# Patient Record
Sex: Female | Born: 1975 | Race: Black or African American | Hispanic: No | Marital: Single | Smoking: Former smoker
Health system: Southern US, Community
[De-identification: ages and names within clinical notes are randomized; demographics above are authoritative.]

## PROBLEM LIST (undated history)

## (undated) DIAGNOSIS — I10 Essential (primary) hypertension: Secondary | ICD-10-CM

## (undated) HISTORY — PX: NO PAST SURGERIES: SHX2092

---

## 1999-06-14 ENCOUNTER — Emergency Department (HOSPITAL_COMMUNITY): Admission: EM | Admit: 1999-06-14 | Discharge: 1999-06-14 | Payer: Self-pay | Admitting: *Deleted

## 2008-06-13 ENCOUNTER — Emergency Department: Payer: Self-pay | Admitting: Emergency Medicine

## 2008-06-15 ENCOUNTER — Emergency Department: Payer: Self-pay | Admitting: Emergency Medicine

## 2008-06-20 ENCOUNTER — Emergency Department: Payer: Self-pay | Admitting: Emergency Medicine

## 2009-02-21 ENCOUNTER — Encounter: Payer: Self-pay | Admitting: Obstetrics and Gynecology

## 2009-04-04 ENCOUNTER — Encounter: Payer: Self-pay | Admitting: Pediatric Cardiology

## 2009-10-20 IMAGING — US US OB FOLLOW-UP - NRPT [facility]
1 series · 14 of 28 positions shown · non-contrast
Comparison: none

[Series 1: us ob follow-up - [facility]pt [facility] · 14 of 48 slices shown]
[im 2/48]
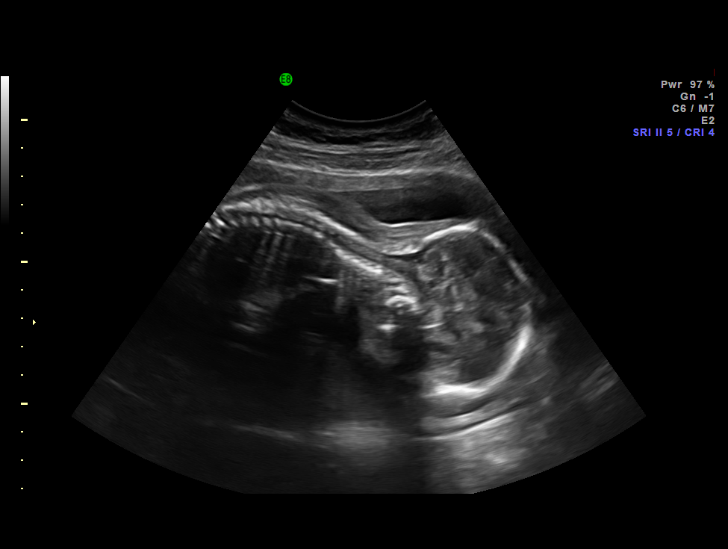
[im 6/48]
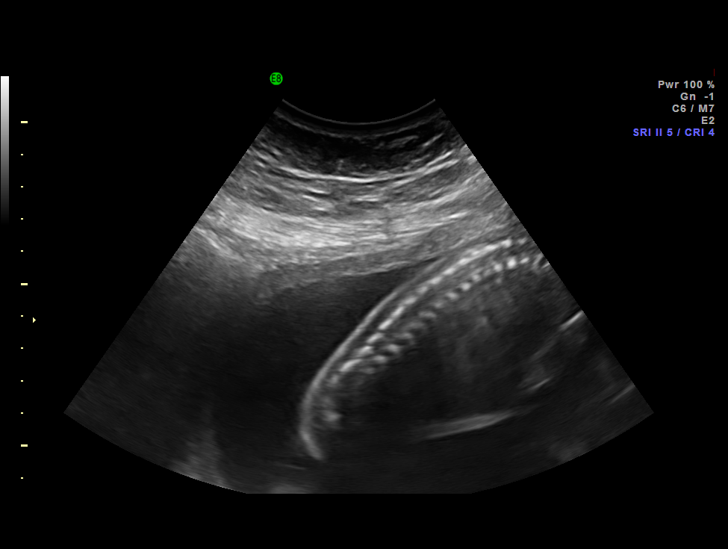
[im 9/48]
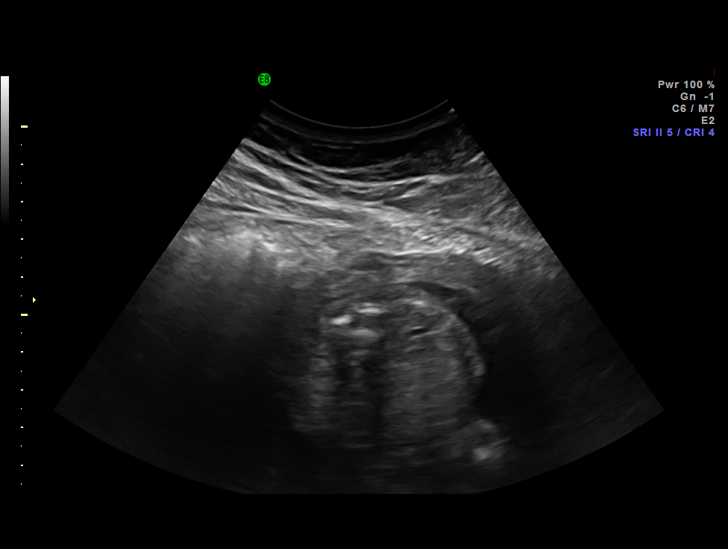
[im 13/48]
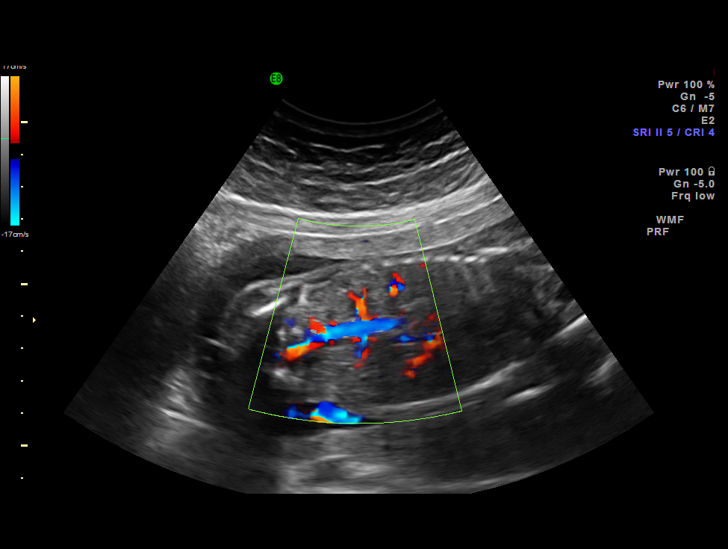
[im 16/48]
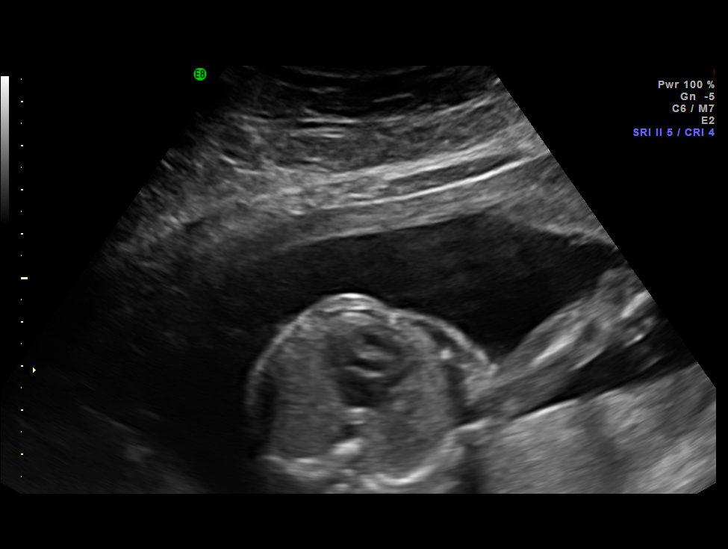
[im 20/48]
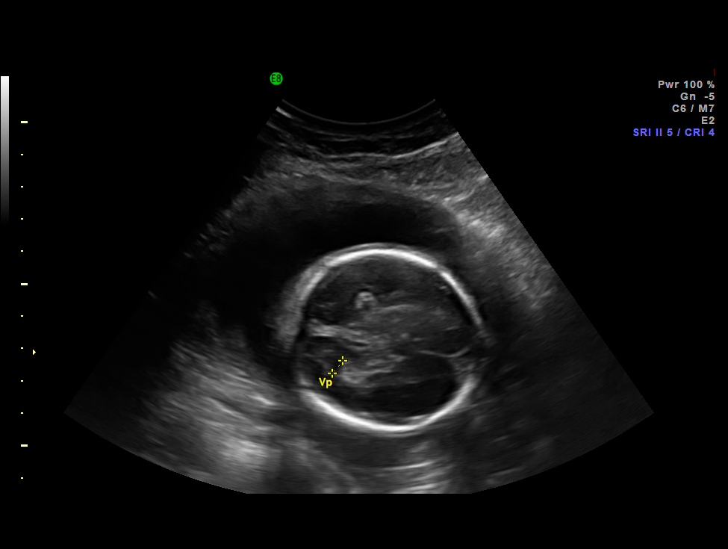
[im 23/48]
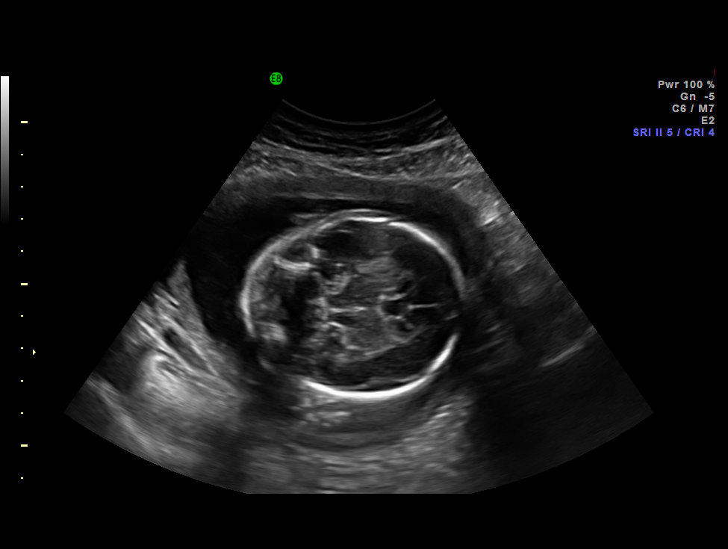
[im 27/48]
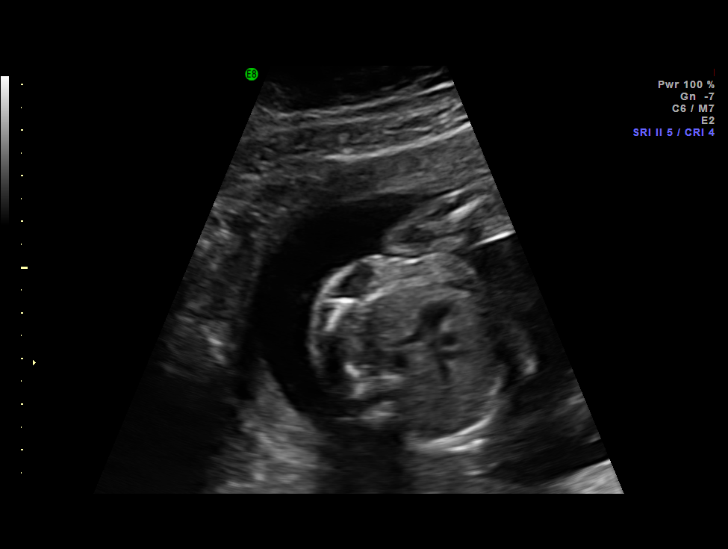
[im 30/48]
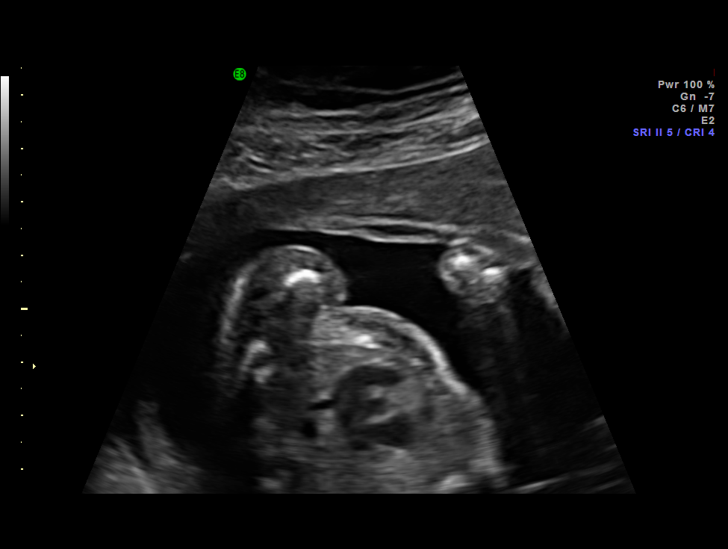
[im 34/48]
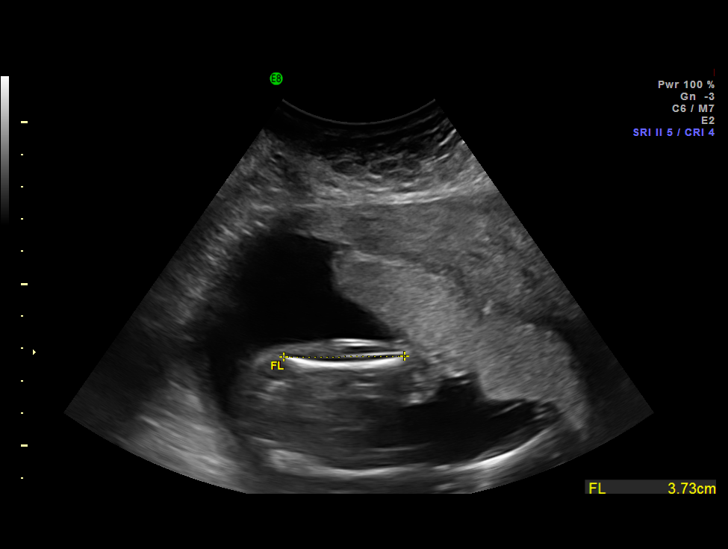
[im 37/48]
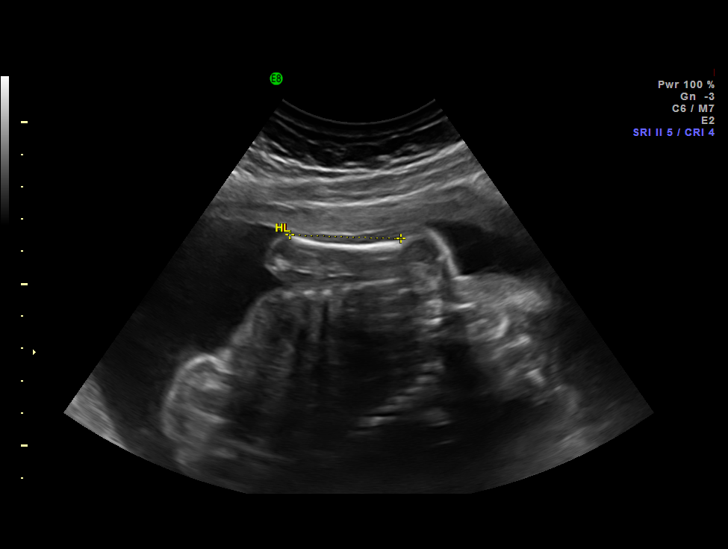
[im 41/48]
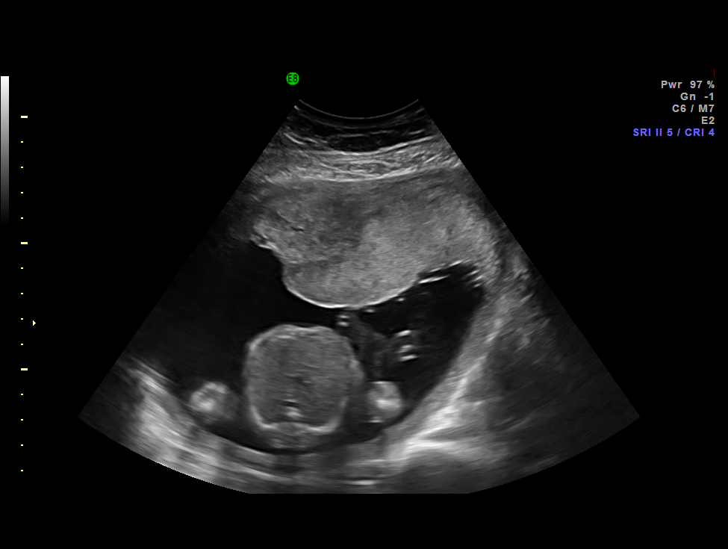
[im 44/48]
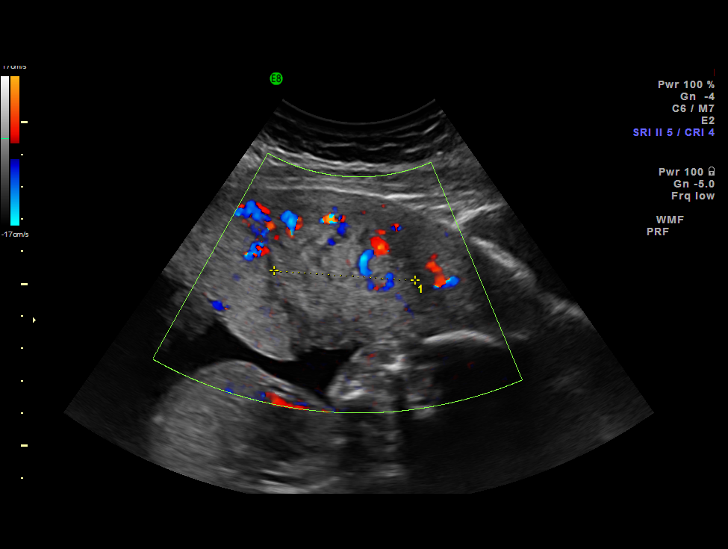
[im 48/48]
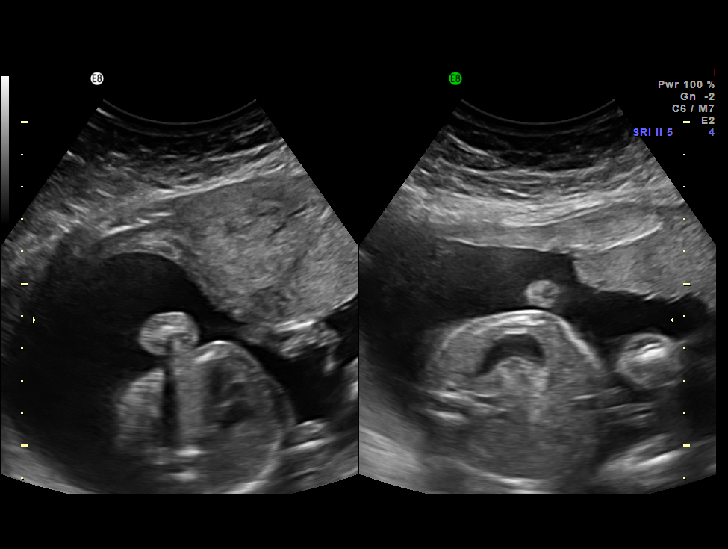

[14 of 28 positions shown; findings below may reference images not displayed]

IMAGES IMPORTED FROM THE [employer] WORKFLOW SYSTEM
NO DICTATION FOR STUDY

## 2011-10-01 ENCOUNTER — Ambulatory Visit: Payer: Self-pay

## 2011-12-21 ENCOUNTER — Ambulatory Visit: Payer: Self-pay

## 2013-07-27 ENCOUNTER — Ambulatory Visit: Payer: Self-pay | Admitting: Physician Assistant

## 2013-07-27 LAB — WET PREP, GENITAL

## 2013-07-27 LAB — GC/CHLAMYDIA PROBE AMP

## 2014-07-17 ENCOUNTER — Ambulatory Visit: Payer: Self-pay | Admitting: Internal Medicine

## 2015-07-19 ENCOUNTER — Ambulatory Visit
Admission: EM | Admit: 2015-07-19 | Discharge: 2015-07-19 | Disposition: A | Discharge status: Home / Self Care | Payer: BLUE CROSS/BLUE SHIELD | Attending: Family Medicine | Admitting: Family Medicine

## 2015-07-19 ENCOUNTER — Encounter: Payer: Self-pay | Admitting: Emergency Medicine

## 2015-07-19 DIAGNOSIS — I10 Essential (primary) hypertension: Secondary | ICD-10-CM

## 2015-07-19 DIAGNOSIS — N912 Amenorrhea, unspecified: Secondary | ICD-10-CM | POA: Diagnosis not present

## 2015-07-19 LAB — BASIC METABOLIC PANEL
Anion gap: 6 (ref 5–15)
BUN: 17 mg/dL (ref 6–20)
CHLORIDE: 100 mmol/L — AB (ref 101–111)
CO2: 30 mmol/L (ref 22–32)
Calcium: 9.1 mg/dL (ref 8.9–10.3)
Creatinine, Ser: 0.6 mg/dL (ref 0.44–1.00)
GFR calc Af Amer: >60 mL/min (ref >60)
GLUCOSE: 110 mg/dL — AB (ref 65–99)
Potassium: 4.1 mmol/L (ref 3.5–5.1)
SODIUM: 136 mmol/L (ref 135–145)

## 2015-07-19 LAB — CBC WITH DIFFERENTIAL/PLATELET
Basophils Absolute: 0 10*3/uL (ref 0–0.1)
Basophils Relative: 1 %
EOS ABS: 0.2 10*3/uL (ref 0–0.7)
EOS PCT: 4 %
HCT: 37.4 % (ref 35.0–47.0)
Hemoglobin: 12.5 g/dL (ref 12.0–16.0)
LYMPHS ABS: 1.6 10*3/uL (ref 1.0–3.6)
Lymphocytes Relative: 23 %
MCH: 30.5 pg (ref 26.0–34.0)
MCHC: 33.3 g/dL (ref 32.0–36.0)
MCV: 91.6 fL (ref 80.0–100.0)
Monocytes Absolute: 0.6 10*3/uL (ref 0.2–0.9)
Monocytes Relative: 9 %
Neutro Abs: 4.4 10*3/uL (ref 1.4–6.5)
Neutrophils Relative %: 63 %
PLATELETS: 258 10*3/uL (ref 150–440)
RBC: 4.09 MIL/uL (ref 3.80–5.20)
RDW: 13.9 % (ref 11.5–14.5)
WBC: 6.9 10*3/uL (ref 3.6–11.0)

## 2015-07-19 LAB — HCG, QUANTITATIVE, PREGNANCY: hCG, Beta Chain, Quant, S: <1 m[IU]/mL (ref <5)

## 2015-07-19 MED ORDER — HYDROCHLOROTHIAZIDE 12.5 MG PO CAPS: 12.5000 mg | ORAL_CAPSULE | Freq: Every day | ORAL | Status: DC

## 2015-07-19 NOTE — Discharge Instructions (Signed)
As discussed follow-up closely with your primary care physician and OB/GYN. Practice safe sex. As discussed use backup contraception.   Follow-up within the next week.  Monitor diet. Follow DASH diet. Exercise regularly.  Return to urgent care proceed to the ER for chest pain, shortness of breath, dizziness, weakness, persistent headaches, new or worsening concerns.   Menstruation Menstruation is the monthly passing of blood, tissue, fluid, and mucus. It is also known as a period. Your body is shedding the lining of the uterus. The flow of blood usually occurs during 3-7 consecutive days each month. Hormones control the menstrual cycle. Hormones are a chemical substance produced by endocrine glands in the body to regulate different bodily functions. The first menstrual period may start any time between age 33 years to 65 years. However, it usually starts around age 33 years. Some girls have regular monthly menstrual cycles right from the beginning. However, it is not unusual to have only a couple of drops of blood or spotting when you first start menstruating. It is also not unusual to have two periods a month or miss a month or two when first starting your periods. SYMPTOMS   Mild to moderate abdominal cramps.  Aching or pain in the lower back area. Symptoms may occur 5-10 days before your menstrual period starts. These symptoms are referred to as premenstrual syndrome (PMS). These symptoms can include:  Headache.  Breast tenderness and swelling.  Bloating.  Tiredness (fatigue).  Mood changes.  Craving for certain foods. These are normal signs and symptoms and can vary in severity. To help relieve these problems, ask your caregiver if you can take over-the-counter medications for pain or discomfort. If the symptoms are not controllable, see your caregiver for help.  HORMONES INVOLVED IN MENSTRUATION Menstruation comes about because of hormones produced by the pituitary gland in the  brain and the ovaries that affect the uterine lining. First, the pituitary gland in the brain produces the hormone follicle stimulating hormone [facility]). [place] stimulates the ovaries to produce estrogen, which thickens the uterine lining and begins to develop an egg in the ovary. About 14 days later, the pituitary gland produces another hormone called luteinizing hormone (LH). LH causes the egg to come out of a sac in the ovary (ovulation). The empty sac on the ovary called the corpus luteum is stimulated by another hormone from the pituitary gland called luteotropin. The corpus luteum begins to produce the estrogen and progesterone hormone. The progesterone hormone prepares the lining of the uterus to have the fertilized egg (egg combined with sperm) attach to the lining of the uterus and begin to develop into a fetus. If the egg is not fertilized, the corpus luteum stops producing estrogen and progesterone, it disappears, the lining of the uterus sloughs off and a menstrual period begins. Then the menstrual cycle starts all over again and will continue monthly unless pregnancy occurs or menopause begins. The secretion of hormones is complex. Various parts of the body become involved in many chemical activities. Female sex hormones have other functions in a woman's body as well. Estrogen increases a woman's sex drive (libido). It naturally helps body get rid of fluids (diuretic). It also aids in the process of building new bone. Therefore, maintaining hormonal health is essential to all levels of a woman's well being. These hormones are usually present in normal amounts and cause you to menstruate. It is the relationship between the (small) levels of the hormones that is critical. When the balance is upset,  menstrual irregularities can occur. HOW DOES THE MENSTRUAL CYCLE HAPPEN?  Menstrual cycles vary in length from 21-35 days with an average of 29 days. The cycle begins on the first day of bleeding. At this time,  the pituitary gland in the brain releases [place] that travels through the bloodstream to the ovaries. The [facility] stimulates the follicles in the ovaries. This prepares the body for ovulation that occurs around the 14th day of the cycle. The ovaries produce estrogen, and this makes sure conditions are right in the uterus for implantation of the fertilized egg.  When the levels of estrogen reach a high enough level, it signals the gland in the brain (pituitary gland) to release a surge of LH. This causes the release of the ripest egg from its follicle (ovulation). Usually only one follicle releases one egg, but sometimes more than one follicle releases an egg especially when stimulating the ovaries for in vitro fertilization. The egg can then be collected by either fallopian tube to await fertilization. The burst follicle within the ovary that is left behind is now called the corpus luteum or "yellow body." The corpus luteum continues to give off (secrete) reduced amounts of estrogen. This closes and hardens the cervix. It dries up the mucus to the naturally infertile condition.  The corpus luteum also begins to give off greater amounts of progesterone. This causes the lining of the uterus (endometrium) to thicken even more in preparation for the fertilized egg. The egg is starting to journey down from the fallopian tube to the uterus. It also signals the ovaries to stop releasing eggs. It assists in returning the cervical mucus to its infertile state.  If the egg implants successfully into the womb lining and pregnancy occurs, progesterone levels will continue to raise. It is often this hormone that gives some pregnant women a feeling of well being, like a "natural high." Progesterone levels drop again after childbirth.  If fertilization does not occur, the corpus luteum dies, stopping the production of hormones. This sudden drop in progesterone causes the uterine lining to break down, accompanied by blood  (menstruation).  This starts the cycle back at day 1. The whole process starts all over again. Woman go through this cycle every month from puberty to menopause. Women have breaks only for pregnancy and breastfeeding (lactation), unless the woman has health problems that affect the female hormone system or chooses to use oral contraceptives to have unnatural menstrual periods. HOME CARE INSTRUCTIONS   Keep track of your periods by using a calendar.  If you use tampons, get the least absorbent to avoid toxic shock syndrome.  Do not leave tampons in the vagina over night or longer than 6 hours.  Wear a sanitary pad over night.  Exercise 3-5 times a week or more.  Avoid foods and drinks that you know will make your symptoms worse before or during your period. SEEK MEDICAL CARE IF:   You develop a fever with your period.  Your periods are lasting more than 7 days.  Your period is so heavy that you have to change pads or tampons every 30 minutes.  You develop clots with your period and never had clots before.  You cannot get relief from over-the-counter medication for your symptoms.  Your period has not started, and it has been longer than 35 days.   This information is not intended to replace advice given to you by your health care provider. Make sure you discuss any questions you have with your  health care provider.   Document Released: 06/22/2002 Document Revised: 03/23/2015 Document Reviewed: 01/29/2013 Elsevier Interactive Patient Education 2016 [employer].  Hypertension Hypertension, commonly called high blood pressure, is when the force of blood pumping through your arteries is too strong. Your arteries are the blood vessels that carry blood from your heart throughout your body. A blood pressure reading consists of a higher number over a lower number, such as 110/72. The higher number (systolic) is the pressure inside your arteries when your heart pumps. The lower number  (diastolic) is the pressure inside your arteries when your heart relaxes. Ideally you want your blood pressure below 120/80. Hypertension forces your heart to work harder to pump blood. Your arteries may become narrow or stiff. Having untreated or uncontrolled hypertension can cause heart attack, stroke, kidney disease, and other problems. RISK FACTORS Some risk factors for high blood pressure are controllable. Others are not.  Risk factors you cannot control include:   Race. You may be at higher risk if you are African American.  Age. Risk increases with age.  Gender. Men are at higher risk than women before age 35 years. After age 52, women are at higher risk than men. Risk factors you can control include:  Not getting enough exercise or physical activity.  Being overweight.  Getting too much fat, sugar, calories, or salt in your diet.  Drinking too much alcohol. SIGNS AND SYMPTOMS Hypertension does not usually cause signs or symptoms. Extremely high blood pressure (hypertensive crisis) may cause headache, anxiety, shortness of breath, and nosebleed. DIAGNOSIS To check if you have hypertension, your health care provider will measure your blood pressure while you are seated, with your arm held at the level of your heart. It should be measured at least twice using the same arm. Certain conditions can cause a difference in blood pressure between your right and left arms. A blood pressure reading that is higher than normal on one occasion does not mean that you need treatment. If it is not clear whether you have high blood pressure, you may be asked to return on a different day to have your blood pressure checked again. Or, you may be asked to monitor your blood pressure at home for 1 or more weeks. TREATMENT Treating high blood pressure includes making lifestyle changes and possibly taking medicine. Living a healthy lifestyle can help lower high blood pressure. You may need to change some of  your habits. Lifestyle changes may include:  Following the DASH diet. This diet is high in fruits, vegetables, and whole grains. It is low in salt, red meat, and added sugars.  Keep your sodium intake below 2,300 mg per day.  Getting at least 30-45 minutes of aerobic exercise at least 4 times per week.  Losing weight if necessary.  Not smoking.  Limiting alcoholic beverages.  Learning ways to reduce stress. Your health care provider may prescribe medicine if lifestyle changes are not enough to get your blood pressure under control, and if one of the following is true:  You are 51-50 years of age and your systolic blood pressure is above 140.  You are 3 years of age or older, and your systolic blood pressure is above 150.  Your diastolic blood pressure is above 90.  You have diabetes, and your systolic blood pressure is over [id number] or your diastolic blood pressure is over 90.  You have kidney disease and your blood pressure is above 140/90.  You have heart disease and your blood pressure  is above 140/90. Your personal target blood pressure may vary depending on your medical conditions, your age, and other factors. HOME CARE INSTRUCTIONS  Have your blood pressure rechecked as directed by your health care provider.   Take medicines only as directed by your health care provider. Follow the directions carefully. Blood pressure medicines must be taken as prescribed. The medicine does not work as well when you skip doses. Skipping doses also puts you at risk for problems.  Do not smoke.   Monitor your blood pressure at home as directed by your health care provider. SEEK MEDICAL CARE IF:   You think you are having a reaction to medicines taken.  You have recurrent headaches or feel dizzy.  You have swelling in your ankles.  You have trouble with your vision. SEEK IMMEDIATE MEDICAL CARE IF:  You develop a severe headache or confusion.  You have unusual weakness, numbness,  or feel faint.  You have severe chest or abdominal pain.  You vomit repeatedly.  You have trouble breathing. MAKE SURE YOU:   Understand these instructions.  Will watch your condition.  Will get help right away if you are not doing well or get worse.   This information is not intended to replace advice given to you by your health care provider. Make sure you discuss any questions you have with your health care provider.   Document Released: 07/02/2005 Document Revised: 11/16/2014 Document Reviewed: 04/24/2013 Elsevier Interactive Patient Education [employer].

## 2015-07-19 NOTE — ED Notes (Addendum)
Pt last menstrual period in September, taken 4 store pregnancy tests, all negative. Most recent home urine test last week. Pt was on spentricks birth control, in Sept. Her doctor gave her a lower dose birth control because high BP. Pt stopped taking the new birth control pill in October. Pt has not been to see PCP about BP yet. Pt concerned pregnant or premenopausal.

## 2015-07-19 NOTE — ED Provider Notes (Signed)
[facility]  ____________________________________________  Time seen: Approximately 7:19 PM  I have reviewed the triage vital signs and the nursing notes.   HISTORY  Chief Complaint Amenorrhea   HPI Latice A Bruemmer is a 40 y.o. female patient presents for the request for pregnancy test. Patient states that in September she was evaluated by her OB/GYN for her yearly exam. Patient states at that time her doctor discussed with her that her blood pressure was elevated. Patient reports that because her blood pressure was elevated at that time her doctor changed her oral birth control medications. Reports that she change the oral birth control medicine to a prescription that had lower counseled estrogen, and patient was encouraged to follow-up with her primary care physician for adjusting the blood pressure. Patient states that she has not followed up with her primary care physician yet, but reports that she was planning on doing so in the next few weeks. Patient reports last sexual encounter was approximately 2 weeks ago.  Patient states that in switching oral birth control medicines she did not have a menstrual cycle in October and states that because of this she then stopped her birth control completely as concerned she could've become pregnant. Patient reports that she has since been using condoms for sexual activity. Reports that she is sexually active with 1 partner. Denies sexual partner changes. Reports that her menstrual in September was normal. Denies vaginal odor, vaginal discharge, vaginal complaints. Patient denies concerns for STDs. Patient states that she took multiple pregnancy tests at home and they were all negative. Patient states that she just wanted to make sure that she was not pregnant and also states that she is planning on resuming the birth control medicine that was prescribed by her OB/GYN.  Patient reports that she's been told her blood pressure was borderline  elevated in the last 1-2 years. Patient present she has a strong family history of hypertension.  Denies chest pain, shortness of breath, dizziness, headaches, weakness, extremity swelling or other complaints. Denies nausea, vomiting, diarrhea, breast tenderness, weight gain or weight loss. Reports continues to eat and drink as normal.  Patient pressure primary care physician is at [facility].  History reviewed. No pertinent past medical history.  There are no active problems to display for this patient.   History reviewed. No pertinent past surgical history.  No current outpatient prescriptions on file.  Allergies Review of patient's allergies indicates no known allergies.   family history Mother: HTN Father:HTN Brother: HTN  Social History Social History  Substance Use Topics  . Smoking status: Former Research scientist (life sciences)  . Smokeless tobacco: None  . Alcohol Use: Yes    Review of Systems Constitutional: No fever/chills Eyes: No visual changes. ENT: No sore throat. Cardiovascular: Denies chest pain. Respiratory: Denies shortness of breath. Gastrointestinal: No abdominal pain.  No nausea, no vomiting.  No diarrhea.  No constipation. Genitourinary: Negative for dysuria. Musculoskeletal: Negative for back pain. Skin: Negative for rash. Neurological: Negative for headaches, focal weakness or numbness.  10-point ROS otherwise negative.  ____________________________________________   PHYSICAL EXAM:  VITAL SIGNS: ED Triage Vitals  Enc Vitals Group     BP 07/19/15 1808 168/124 mmHg     Pulse Rate 07/19/15 1808 91     Resp 07/19/15 1808 18     Temp 07/19/15 1808 98.1 F (36.7 C)     Temp Source 07/19/15 1808 Oral     SpO2 07/19/15 1808 100 %     Weight 07/19/15 1808  184 lb (83.462 kg)     Height 07/19/15 1808 5\' 8"  (1.727 m)     Head Cir --      Peak Flow --      Pain Score --      Pain Loc --      Pain Edu? --      Excl. in [place]? --    Today's Vitals   07/19/15  1808 07/19/15 1907  BP: 168/124 163/102  Pulse: 91   Temp: 98.1 F (36.7 C)   TempSrc: Oral   Resp: 18   Height: 5\' 8"  (1.727 m)   Weight: 184 lb (83.462 kg)   SpO2: 100%     Constitutional: Alert and oriented. Well appearing and in no acute distress. Eyes: Conjunctivae are normal. PERRL. EOMI. Head: Atraumatic.  Nose: No congestion/rhinnorhea.  Mouth/Throat: Mucous membranes are moist.  Oropharynx non-erythematous.  Neck: No stridor.  No cervical spine tenderness to palpation. Hematological/Lymphatic/Immunilogical: No cervical lymphadenopathy. Cardiovascular: Normal rate, regular rhythm. Grossly normal heart sounds.  Good peripheral circulation. Respiratory: Normal respiratory effort.  No retractions. Lungs CTAB. Gastrointestinal: Soft and nontender. No distention. Normal Bowel sounds.  No CVA tenderness. Musculoskeletal: No lower or upper extremity tenderness nor edema.Bilateral pedal pulses equal and easily palpated.  Neurologic:  Normal speech and language. No gross focal neurologic deficits are appreciated. No gait instability. Skin:  Skin is warm, dry and intact. No rash noted. Psychiatric: Mood and affect are normal. Speech and behavior are normal.  ____________________________________________   LABS (all labs ordered are listed, but only abnormal results are displayed)  Labs Reviewed  BASIC METABOLIC PANEL - Abnormal; Notable for the following:    Chloride 100 (*)    Glucose, Bld 110 (*)    All other components within normal limits  HCG, QUANTITATIVE, PREGNANCY  CBC WITH DIFFERENTIAL/PLATELET     INITIAL IMPRESSION / ASSESSMENT AND PLAN / ED COURSE  Pertinent labs & imaging results that were available during my care of the patient were reviewed by me and considered in my medical decision making (see chart for details).  Very well-appearing patient. No acute distress. Presents for request of pregnancy test. Patient states that she recently has changed oral birth  control pills and states that she then stopped them for 2 months and states that she wants to make sure that she is not pregnant. Patient patient also reports that she has been told in the past that she is borderline high blood pressure and reports that her blood pressure is elevated at her last yearly exam with her OB/GYN. Reports that she was directed to follow-up with her PCP for blood pressure management but states that she has not yet followed up. Patient states she plans to follow-up with her primary care physician next 2 weeks.   Patient reports that she generally has normal menstrual cycles until the recent medication changes. Patient reports that her OB/GYN told her that her menstrual was maybe are irregular at first with the medicine changes. HCG less than 1. Discussed patient pregnancy is negative at this time. Counseled regarding safe sex and backup safety measures as patient states that she does not want to become pregnant. Also discussed with patient in detail regarding importance of a pressure follow-up and management. Patient denies chest pain or shortness of breath, dizziness, vision changes., weakness, headaches or other complaints. Patient states that she feels well. Patient asymptomatic of elevated blood pressure. Blood pressure recheck 163/102. Discussed with patient to follow-up in the next 2 weeks  with PCP for blood pressure management. Will evaluate CBC and BMP and discussed with patient initiation of antihypertensive medications. Counseled patient on regular exercise, DASH diet as well as monitoring blood pressure closely and keep a journal at home.Also counseled patient to follow-up closely with OB/GYN in regards to menstrual cycles.  Patient states that she had to leave the urgent care prior to the labs being returned. Prior patient leaving urgent care discussed with her prescription of HCTZ pending unremarkable labs, as well as discussed risks and benefits of medications. Labs  reviewed. Will start patient on HCTZ 12.5 mg daily. Called patient twice on cell phone and left message for patient to return phone call. Prescription sent to patient's pharmacy electronically.  Discussed follow up with Primary care physician this week. Discussed follow up and return parameters urgent care or ER for chest pain, shortness of breath, weakness, dizziness, headaches, vision changes, no resolution or any worsening concerns. Patient verbalized understanding and agreed to plan.   ____________________________________________   FINAL CLINICAL IMPRESSION(S) / ED DIAGNOSES  Final diagnoses:  Amenorrhea  Essential hypertension     Marylene Land, NP 07/19/15 2025  Addendum:07/18/2014 2120: Called and discussed lab results with patient as well as rediscussed hypertension medication. Patient states that she will call to schedule her PCP follow up tomorrow as well as start medicine tomorrow.Discussed follow up with Primary care physician this week. Discussed follow up and return parameters including no resolution or any worsening concerns. Patient verbalized understanding and agreed to plan.    Marylene Land, NP 07/19/15 2121

## 2015-07-20 ENCOUNTER — Telehealth: Payer: Self-pay | Admitting: *Deleted

## 2015-07-20 NOTE — ED Notes (Signed)
Patient called and informed that all of her lab results ar normal. Patient confirmed understanding of information.

## 2016-06-21 ENCOUNTER — Telehealth: Payer: Self-pay

## 2016-12-03 ENCOUNTER — Ambulatory Visit
Admission: EM | Admit: 2016-12-03 | Discharge: 2016-12-03 | Disposition: A | Discharge status: Home / Self Care | Payer: BLUE CROSS/BLUE SHIELD | Attending: Family Medicine | Admitting: Family Medicine

## 2016-12-03 ENCOUNTER — Encounter: Payer: Self-pay | Admitting: *Deleted

## 2016-12-03 DIAGNOSIS — B9689 Other specified bacterial agents as the cause of diseases classified elsewhere: Secondary | ICD-10-CM | POA: Diagnosis not present

## 2016-12-03 DIAGNOSIS — N760 Acute vaginitis: Secondary | ICD-10-CM | POA: Diagnosis not present

## 2016-12-03 LAB — CHLAMYDIA/NGC RT PCR (ARMC ONLY)
Chlamydia Tr: NOT DETECTED
N gonorrhoeae: NOT DETECTED

## 2016-12-03 LAB — WET PREP, GENITAL
SPERM: NONE SEEN
TRICH WET PREP: NONE SEEN
YEAST WET PREP: NONE SEEN

## 2016-12-03 MED ORDER — METRONIDAZOLE 500 MG PO TABS: 500.0000 mg | ORAL_TABLET | Freq: Two times a day (BID) | ORAL | 0 refills | Status: DC

## 2016-12-03 NOTE — Discharge Instructions (Signed)
Take medication as prescribed. Rest. Drink plenty of fluids. No alcohol consumption with medication.  Follow up with your primary care physician this week as needed. Return to Urgent care for new or worsening concerns.

## 2016-12-03 NOTE — ED Triage Notes (Signed)
Patient has had symptom of vaginal discharge for 1 month. Patient does have a history of bacterial vaginitis.

## 2016-12-03 NOTE — ED Provider Notes (Signed)
[facility] ____________________________________________  Time seen: Approximately 2:30 PM  I have reviewed the triage vital signs and the nursing notes.   HISTORY  Chief Complaint Vaginal Discharge   HPI Veronica Berg is a 41 y.o. female presenting for evaluation of one month whitish vaginal discharge with odor. Denies any urinary frequency, urinary urgency, pain with urination or dysuria. States no vaginal pain, abdominal pain, back pain or discomfort. Patient reports that she has had bacterial vaginosis in the past with similar presentation. Patient reports that she is sexually active with 1 partner. Discussed with patient if any concerns of STDs, patient states would like to have gonorrhea and chlamydia testing, declines other testing for other STDs. Reports continues to eat and drink well. Denies any aggravating or alleviating factors.  Denies chest pain, shortness of breath, abdominal pain, dysuria, extremity pain, extremity swelling or rash. Denies recent sickness. Denies recent antibiotic use.   Patient's last menstrual period was 11/08/2016. Denies concern of pregnancy [place]: PCP   History reviewed. No pertinent past medical history. Denies  There are no active problems to display for this patient.   History reviewed. No pertinent surgical history.   No current facility-administered medications for this encounter.   Current Outpatient Prescriptions:  .  hydrochlorothiazide (MICROZIDE) 12.5 MG capsule, Take 1 capsule (12.5 mg total) by mouth daily., Disp: 14 capsule, Rfl: 0 .  metroNIDAZOLE (FLAGYL) 500 MG tablet, Take 1 tablet (500 mg total) by mouth 2 (two) times daily., Disp: 14 tablet, Rfl: 0  Allergies Patient has no known allergies.  Family History  Problem Relation Age of Onset  . Hypertension Mother   . Cancer Father     Social History Social History  Substance Use Topics  . Smoking status: Former Research scientist (life sciences)  .  Smokeless tobacco: Never Used  . Alcohol use Yes    Review of Systems Constitutional: No fever/chills Cardiovascular: Denies chest pain. Respiratory: Denies shortness of breath. Gastrointestinal: No abdominal pain.  No nausea, no vomiting.  No diarrhea.  No constipation. Genitourinary: Negative for dysuria. As above.  Musculoskeletal: Negative for back pain. Skin: Negative for rash.   ____________________________________________   PHYSICAL EXAM:  VITAL SIGNS: ED Triage Vitals  Enc Vitals Group     BP 12/03/16 1406 (!) 155/101     Pulse Rate 12/03/16 1406 74     Resp 12/03/16 1406 16     Temp 12/03/16 1406 98.2 F (36.8 C)     Temp Source 12/03/16 1406 Oral     SpO2 12/03/16 1406 100 %     Weight 12/03/16 1407 174 lb (78.9 kg)     Height 12/03/16 1407 5\' 7"  (1.702 m)     Head Circumference --      Peak Flow --      Pain Score 12/03/16 1411 0     Pain Loc --      Pain Edu? --      Excl. in [place]? --     Constitutional: Alert and oriented. Well appearing and in no acute distress. Cardiovascular: Normal rate, regular rhythm. Grossly normal heart sounds.  Good peripheral circulation. Respiratory: Normal respiratory effort without tachypnea nor retractions. Breath sounds are clear and equal bilaterally. No wheezes, rales, rhonchi. Gastrointestinal: Soft and nontender. No distention. No CVA tenderness. Pelvic exam completed with Melissa CMA at bedside as chaperone. External : Normal appearance, no rash or lesion. Speculum : Moderate amount of whitish vaginal discharge, no foreign bodies, no bleeding, cervical os  closed. Bimanual : Nontender, no cervical or adnexal tenderness.   Musculoskeletal:   No midline cervical, thoracic or lumbar tenderness to palpation.  Neurologic:  Normal speech and language.Speech is normal. No gait instability.  Skin:  Skin is warm, dry. Psychiatric: Mood and affect are normal. Speech and behavior are normal. Patient exhibits appropriate insight and  judgment   ___________________________________________   LABS (all labs ordered are listed, but only abnormal results are displayed)  Labs Reviewed  WET PREP, GENITAL - Abnormal; Notable for the following:       Result Value   Clue Cells Wet Prep HPF POC PRESENT (*)    WBC, Wet Prep HPF POC FEW (*)    All other components within normal limits  CHLAMYDIA/NGC RT PCR ([facility] ONLY)    PROCEDURES Procedures    INITIAL IMPRESSION / ASSESSMENT AND PLAN / ED COURSE  Pertinent labs & imaging results that were available during my care of the patient were reviewed by me and considered in my medical decision making (see chart for details).  Well-appearing patient. No acute distress. Presents for evaluation of vaginal discharge. Patient requests a gonorrhea and chlamydia testing, declines other STD testing including syphilis, HIV, herpes and hepatitis. Pelvic exam completed. clue cells positive. Will treat patient with oral Flagyl for bacterial vaginosis. Patient declined any dysuria or concerns of pregnancy, declined urine. Counseled regarding no alcohol with antibiotic. Encourage rest, fluids, supportive care and pelvic rest until some symptoms resolved and completion of medication. Discussed indication, risks and benefits of medications with patient.  Discussed follow up with Primary care physician this week. Discussed follow up and return parameters including no resolution or any worsening concerns. Patient verbalized understanding and agreed to plan.   ____________________________________________   FINAL CLINICAL IMPRESSION(S) / ED DIAGNOSES  Final diagnoses:  BV (bacterial vaginosis)     Discharge Medication List as of 12/03/2016  3:08 PM    START taking these medications   Details  metroNIDAZOLE (FLAGYL) 500 MG tablet Take 1 tablet (500 mg total) by mouth 2 (two) times daily., Starting Mon 12/03/2016, Normal        Note: This dictation was prepared with Dragon  dictation along with smaller phrase technology. Any transcriptional errors that result from this process are unintentional.         Marylene Land, NP 12/03/16 2226

## 2016-12-05 ENCOUNTER — Telehealth: Payer: Self-pay

## 2016-12-05 NOTE — Telephone Encounter (Signed)
Tried calling patient to inform her of test results, no answer and no voicemail will try again later.

## 2016-12-05 NOTE — Telephone Encounter (Signed)
-----   Message from Marylene Land, NP sent at 12/05/2016  8:24 AM EDT ----- Gonorrhea and chlamydia negative.

## 2017-07-22 ENCOUNTER — Ambulatory Visit (INDEPENDENT_AMBULATORY_CARE_PROVIDER_SITE_OTHER)
Admit: 2017-07-22 | Discharge: 2017-07-22 | Disposition: A | Discharge status: Home / Self Care | Payer: BLUE CROSS/BLUE SHIELD | Attending: Family Medicine | Admitting: Family Medicine

## 2017-07-22 ENCOUNTER — Other Ambulatory Visit: Payer: Self-pay

## 2017-07-22 ENCOUNTER — Ambulatory Visit
Admission: EM | Admit: 2017-07-22 | Discharge: 2017-07-22 | Disposition: A | Discharge status: Home / Self Care | Payer: BLUE CROSS/BLUE SHIELD | Attending: Family Medicine | Admitting: Family Medicine

## 2017-07-22 DIAGNOSIS — N938 Other specified abnormal uterine and vaginal bleeding: Secondary | ICD-10-CM

## 2017-07-22 DIAGNOSIS — N921 Excessive and frequent menstruation with irregular cycle: Secondary | ICD-10-CM | POA: Diagnosis not present

## 2017-07-22 DIAGNOSIS — N898 Other specified noninflammatory disorders of vagina: Secondary | ICD-10-CM | POA: Diagnosis not present

## 2017-07-22 DIAGNOSIS — Z3202 Encounter for pregnancy test, result negative: Secondary | ICD-10-CM | POA: Diagnosis not present

## 2017-07-22 DIAGNOSIS — B9689 Other specified bacterial agents as the cause of diseases classified elsewhere: Secondary | ICD-10-CM | POA: Diagnosis not present

## 2017-07-22 DIAGNOSIS — O039 Complete or unspecified spontaneous abortion without complication: Secondary | ICD-10-CM

## 2017-07-22 DIAGNOSIS — R109 Unspecified abdominal pain: Secondary | ICD-10-CM

## 2017-07-22 DIAGNOSIS — N760 Acute vaginitis: Secondary | ICD-10-CM | POA: Diagnosis not present

## 2017-07-22 LAB — URINALYSIS, COMPLETE (UACMP) WITH MICROSCOPIC
BILIRUBIN URINE: NEGATIVE
Glucose, UA: NEGATIVE mg/dL
Hgb urine dipstick: NEGATIVE
Ketones, ur: NEGATIVE mg/dL
Leukocytes, UA: NEGATIVE
NITRITE: NEGATIVE
PH: 7 (ref 5.0–8.0)
Protein, ur: NEGATIVE mg/dL
Specific Gravity, Urine: 1.02 (ref 1.005–1.030)

## 2017-07-22 LAB — WET PREP, GENITAL
SPERM: NONE SEEN
TRICH WET PREP: NONE SEEN
YEAST WET PREP: NONE SEEN

## 2017-07-22 LAB — PREGNANCY, URINE: Preg Test, Ur: NEGATIVE

## 2017-07-22 MED ORDER — METRONIDAZOLE 500 MG PO TABS: 500.0000 mg | ORAL_TABLET | Freq: Two times a day (BID) | ORAL | 0 refills | Status: DC

## 2017-07-22 MED ORDER — METRONIDAZOLE 0.75 % VA GEL: 1.0000 | Freq: Every day | VAGINAL | 0 refills | Status: AC

## 2017-07-22 NOTE — ED Provider Notes (Addendum)
[facility]    CSN: [id number] Arrival date & time: 07/22/17  1045     History   Chief Complaint Chief Complaint  Patient presents with  . Amenorrhea    HPI Veronica Berg is a 42 y.o. female.   42 yo female with a c/o vaginal discharge and odor for the past several days, after having had severe cramping and heavy vaginal bleeding 2 weeks ago. Patient states she missed her menstrual period on 12/15 and subsequently had a positive pregnancy test at home before she had the pelvic cramping and vaginal bleeding. Currently denies any pain, cramping, bleeding, dizziness, chest pains, shortness of breath.    The history is provided by the patient.    History reviewed. No pertinent past medical history.  There are no active problems to display for this patient.   Past Surgical History:  Procedure Laterality Date  . NO PAST SURGERIES      OB History    No data available       Home Medications    Prior to Admission medications   Medication Sig Start Date End Date Taking? Authorizing Provider  hydrochlorothiazide (MICROZIDE) 12.5 MG capsule Take 1 capsule (12.5 mg total) by mouth daily. 07/19/15   Marylene Land, NP  metroNIDAZOLE (FLAGYL) 500 MG tablet Take 1 tablet (500 mg total) by mouth 2 (two) times daily. 07/22/17   Norval Gable, MD  metroNIDAZOLE (METROGEL) 0.75 % vaginal gel Place 1 Applicatorful vaginally at bedtime for 7 days. 07/22/17 07/29/17  Norval Gable, MD    Family History Family History  Problem Relation Age of Onset  . Hypertension Mother   . Cancer Father     Social History Social History   Tobacco Use  . Smoking status: Former Research scientist (life sciences)  . Smokeless tobacco: Never Used  Substance Use Topics  . Alcohol use: Yes    Comment: occasionally  . Drug use: No     Allergies   Patient has no known allergies.   Review of Systems Review of Systems   Physical Exam Triage Vital Signs ED Triage Vitals  Enc Vitals Group     BP 07/22/17  1114 (!) 147/77     Pulse Rate 07/22/17 1114 81     Resp 07/22/17 1114 18     Temp 07/22/17 1114 98.2 F (36.8 C)     Temp Source 07/22/17 1114 Oral     SpO2 07/22/17 1114 100 %     Weight --      Height 07/22/17 1112 5' 7.5" (1.715 m)     Head Circumference --      Peak Flow --      Pain Score 07/22/17 1112 0     Pain Loc --      Pain Edu? --      Excl. in [place]? --    No data found.  Updated Vital Signs BP (!) 147/77 (BP Location: Left Arm)   Pulse 81   Temp 98.2 F (36.8 C) (Oral)   Resp 18   Ht 5' 7.5" (1.715 m)   LMP 07/13/2017   SpO2 100%   BMI 26.85 kg/m   Visual Acuity Right Eye Distance:   Left Eye Distance:   Bilateral Distance:    Right Eye Near:   Left Eye Near:    Bilateral Near:     Physical Exam  Constitutional: She appears well-developed and well-nourished. No distress.  Abdominal: Soft. Bowel sounds are normal. She exhibits no distension and no mass.  There is no tenderness. There is no rebound and no guarding. Hernia confirmed negative in the right inguinal area and confirmed negative in the left inguinal area.  Genitourinary: Pelvic exam was performed with patient supine. No labial fusion. There is no rash, tenderness, lesion or injury on the right labia. There is no rash, tenderness, lesion or injury on the left labia. Cervix exhibits no discharge and no friability. No erythema, tenderness or bleeding in the vagina. No foreign body in the vagina. No signs of injury around the vagina. Vaginal discharge found.  Lymphadenopathy: No inguinal adenopathy noted on the right or left side.  Skin: She is not diaphoretic.  Nursing note and vitals reviewed.    UC Treatments / Results  Labs (all labs ordered are listed, but only abnormal results are displayed) Labs Reviewed  WET PREP, GENITAL - Abnormal; Notable for the following components:      Result Value   Clue Cells Wet Prep HPF POC PRESENT (*)    WBC, Wet Prep HPF POC FEW (*)    All other components  within normal limits  URINALYSIS, COMPLETE (UACMP) WITH MICROSCOPIC - Abnormal; Notable for the following components:   Squamous Epithelial / LPF 0-5 (*)    Bacteria, UA FEW (*)    All other components within normal limits  URINE CULTURE  PREGNANCY, URINE    EKG  EKG Interpretation None       Radiology US Pelvic Complete With Transvaginal  Result Date: 07/22/2017 CLINICAL DATA:  Heavy vaginal bleeding from December 29th to July 20, 2017. Negative urine pregnancy test today but positive home pregnancy test on July 05, 2017. EXAM: TRANSABDOMINAL AND TRANSVAGINAL ULTRASOUND OF PELVIS TECHNIQUE: Both transabdominal and transvaginal ultrasound examinations of the pelvis were performed. Transabdominal technique was performed for global imaging of the pelvis including uterus, ovaries, adnexal regions, and pelvic cul-de-sac. It was necessary to proceed with endovaginal exam following the transabdominal exam to visualize the uterus and endometrium. COMPARISON:  No recent studies in [facility] FINDINGS: Uterus Measurements: 8.1 x 4.7 x 5.7 cm. A subcentimeter presumed fibroid is noted in the posterior right aspect of the uterine corpus. Endometrium Thickness: 7.3 mm.  No focal abnormality visualized. Right ovary Measurements: 2.2 x 1.8 x 1.1 cm. Normal appearance/no adnexal mass. Left ovary Measurements: 1.8 x 2.2 x 3.2 cm. Normal appearance/no adnexal mass. Other findings No abnormal free fluid. IMPRESSION: No evidence of an endometrial mass, fluid collection, or abnormal endometrial thickening. Probable subcentimeter right-sided uterine fibroid. Normal appearance of both ovaries.  No adnexal masses or free fluid. Electronically Signed   By: David  Martinique M.D.   On: 07/22/2017 14:13    Procedures Procedures (including critical care time)  Medications Ordered in UC Medications - No data to display   Initial Impression / Assessment and Plan / UC Course  I have reviewed the triage vital signs and  the nursing notes.  Pertinent labs & imaging results that were available during my care of the patient were reviewed by me and considered in my medical decision making (see chart for details).       Final Clinical Impressions(s) / UC Diagnoses   Final diagnoses:  BV (bacterial vaginosis)  Menorrhagia with irregular cycle  Spontaneous miscarriage  (likely)  ED Discharge Orders        Ordered    US PELVIC COMPLETE WITH TRANSVAGINAL     07/22/17 1203    metroNIDAZOLE (FLAGYL) 500 MG tablet  2 times daily     07/22/17  1240    metroNIDAZOLE (METROGEL) 0.75 % vaginal gel  Daily at bedtime     07/22/17 1254     1. Lab results and diagnosis reviewed with patient 2. rx as per orders above; reviewed possible side effects, interactions, risks and benefits  3. Recommend pelvic US (to be done today); discussed results with patient 4. Follow-up prn if symptoms worsen or don't improve  Controlled Substance Prescriptions [place] Controlled Substance Registry consulted? Not Applicable   Norval Gable, MD 07/22/17 Remonia Richter    Norval Gable, MD 07/22/17 [phone]

## 2017-07-22 NOTE — ED Triage Notes (Signed)
Patient states that she had a missed period on 12/15. Patient states that she had a faint line on a pregnancy test on 12/21. Patient states that she had a negative test on 12/23. Patient states that on 12/25 she started having spotting. Reports that on 12/26 she started having severe cramping that made her lay in the bed for 3 days. Patient reports that on 12/29 she started heavy bleeding and that lasted until 1/5. Patient states that today she is not having any pain nor is she bleeding but has noticed a strong odor.

## 2017-07-24 LAB — URINE CULTURE
Culture: NO GROWTH
Special Requests: NORMAL

## 2019-04-13 ENCOUNTER — Ambulatory Visit
Admission: EM | Admit: 2019-04-13 | Discharge: 2019-04-13 | Disposition: A | Discharge status: Home / Self Care | Payer: BC Managed Care – PPO | Attending: Internal Medicine | Admitting: Internal Medicine

## 2019-04-13 ENCOUNTER — Other Ambulatory Visit: Payer: Self-pay

## 2019-04-13 DIAGNOSIS — Z20828 Contact with and (suspected) exposure to other viral communicable diseases: Secondary | ICD-10-CM | POA: Diagnosis not present

## 2019-04-13 DIAGNOSIS — Z20822 Contact with and (suspected) exposure to covid-19: Secondary | ICD-10-CM

## 2019-04-13 HISTORY — DX: Essential (primary) hypertension: I10

## 2019-04-13 NOTE — ED Triage Notes (Signed)
Pt asymptomatic but would like COVID testing due to her mother coming on Thursday and testing positive

## 2019-04-13 NOTE — ED Provider Notes (Signed)
[facility]    CSN: [id number] Arrival date & time: 04/13/19  1818      History   Chief Complaint Chief Complaint  Patient presents with  . Labs Only    HPI Veronica Berg is a 43 y.o. female with history of hypertension comes to urgent care to get tested for COVID-19.  Patient's mother was diagnosed with COVID-19 4 days ago.  Patient has no symptoms.Marland Kitchen   HPI  Past Medical History:  Diagnosis Date  . Hypertension     There are no active problems to display for this patient.   Past Surgical History:  Procedure Laterality Date  . NO PAST SURGERIES      OB History   No obstetric history on file.      Home Medications    Prior to Admission medications   Medication Sig Start Date End Date Taking? Authorizing Provider  hydrochlorothiazide (MICROZIDE) 12.5 MG capsule Take 1 capsule (12.5 mg total) by mouth daily. 07/19/15 04/13/19  Marylene Land, NP    Family History Family History  Problem Relation Age of Onset  . Hypertension Mother   . Cancer Father     Social History Social History   Tobacco Use  . Smoking status: Former Research scientist (life sciences)  . Smokeless tobacco: Never Used  Substance Use Topics  . Alcohol use: Yes    Comment: occasionally  . Drug use: No     Allergies   Patient has no known allergies.   Review of Systems Review of Systems  Constitutional: Negative.   HENT: Negative.   Gastrointestinal: Negative.      Physical Exam Triage Vital Signs ED Triage Vitals  Enc Vitals Group     BP 04/13/19 1841 (!) 160/105     Pulse Rate 04/13/19 1841 72     Resp 04/13/19 1841 17     Temp 04/13/19 1841 98.2 F (36.8 C)     Temp Source 04/13/19 1841 Oral     SpO2 04/13/19 1841 100 %     Weight 04/13/19 1842 192 lb (87.1 kg)     Height 04/13/19 1842 5\' 8"  (1.727 m)     Head Circumference --      Peak Flow --      Pain Score 04/13/19 1842 0     Pain Loc --      Pain Edu? --      Excl. in [place]? --    No data found.  Updated Vital  Signs BP (!) 160/105 (BP Location: Left Arm)   Pulse 72   Temp 98.2 F (36.8 C) (Oral)   Resp 17   Ht 5\' 8"  (1.727 m)   Wt 87.1 kg   LMP 04/06/2019   SpO2 100%   BMI 29.19 kg/m   Visual Acuity Right Eye Distance:   Left Eye Distance:   Bilateral Distance:    Right Eye Near:   Left Eye Near:    Bilateral Near:     Physical Exam Vitals signs and nursing note reviewed.  Constitutional:      General: She is not in acute distress.    Appearance: Normal appearance. She is not ill-appearing or toxic-appearing.  Cardiovascular:     Rate and Rhythm: Normal rate and regular rhythm.     Pulses: Normal pulses.     Heart sounds: Normal heart sounds.  Neurological:     Mental Status: She is alert.      UC Treatments / Results  Labs (all labs ordered are  listed, but only abnormal results are displayed) Labs Reviewed  NOVEL CORONAVIRUS, NAA (HOSP ORDER, SEND-OUT TO REF LAB; TAT 18-24 HRS)    EKG   Radiology No results found.  Procedures Procedures (including critical care time)  Medications Ordered in UC Medications - No data to display  Initial Impression / Assessment and Plan / UC Course  I have reviewed the triage vital signs and the nursing notes.  Pertinent labs & imaging results that were available during my care of the patient were reviewed by me and considered in my medical decision making (see chart for details).     1.  Close exposure to COVID-19 infection: COVID-19 testing done Patient is advised to self isolate until results are available If patient develops any symptoms she could contact the urgent care via virtual visit to address her concerns or symptoms. Final Clinical Impressions(s) / UC Diagnoses   Final diagnoses:  Close Exposure to Covid-19 Virus   Discharge Instructions   None    ED Prescriptions    None     PDMP not reviewed this encounter.   Chase Picket, MD 04/13/19 [phone]

## 2019-04-14 ENCOUNTER — Other Ambulatory Visit: Payer: Self-pay

## 2019-04-14 DIAGNOSIS — Z20822 Contact with and (suspected) exposure to covid-19: Secondary | ICD-10-CM

## 2019-04-15 LAB — NOVEL CORONAVIRUS, NAA: SARS-CoV-2, NAA: NOT DETECTED

## 2019-04-16 LAB — NOVEL CORONAVIRUS, NAA (HOSP ORDER, SEND-OUT TO REF LAB; TAT 18-24 HRS): SARS-CoV-2, NAA: NOT DETECTED

## 2019-05-18 ENCOUNTER — Ambulatory Visit (INDEPENDENT_AMBULATORY_CARE_PROVIDER_SITE_OTHER): Payer: BC Managed Care – PPO | Admitting: Family Medicine

## 2019-05-18 ENCOUNTER — Other Ambulatory Visit: Payer: Self-pay

## 2019-05-18 ENCOUNTER — Encounter: Payer: Self-pay | Admitting: Family Medicine

## 2019-05-18 VITALS — BP 128/82 | HR 79 | Ht 68.0 in | Wt 194.0 lb

## 2019-05-18 DIAGNOSIS — Z83438 Family history of other disorder of lipoprotein metabolism and other lipidemia: Secondary | ICD-10-CM | POA: Diagnosis not present

## 2019-05-18 DIAGNOSIS — E229 Hyperfunction of pituitary gland, unspecified: Secondary | ICD-10-CM | POA: Diagnosis not present

## 2019-05-18 DIAGNOSIS — D352 Benign neoplasm of pituitary gland: Secondary | ICD-10-CM

## 2019-05-18 DIAGNOSIS — Z7689 Persons encountering health services in other specified circumstances: Secondary | ICD-10-CM

## 2019-05-18 DIAGNOSIS — Z8632 Personal history of gestational diabetes: Secondary | ICD-10-CM | POA: Diagnosis not present

## 2019-05-18 DIAGNOSIS — N914 Secondary oligomenorrhea: Secondary | ICD-10-CM

## 2019-05-18 NOTE — Progress Notes (Signed)
    Date:  05/18/2019   Name:  Veronica Berg   DOB:  14-Jun-1976   MRN:  [id number]   Chief Complaint: Establish Care  Patient is a 43 year old female who presents for a establish care exam. The patient reports the following problems: BP/gestational diabetes. Health maintenance has been reviewed need pap.   Review of Systems  There are no active problems to display for this patient.   No Known Allergies  Past Surgical History:  Procedure Laterality Date  . NO PAST SURGERIES      Social History   Tobacco Use  . Smoking status: Former Research scientist (life sciences)  . Smokeless tobacco: Never Used  Substance Use Topics  . Alcohol use: Yes    Comment: occasionally  . Drug use: No     Medication list has been reviewed and updated.  Current Meds  Medication Sig  . aspirin EC 81 MG tablet Take 81 mg by mouth daily.  Marland Kitchen b complex vitamins tablet Take 1 tablet by mouth daily.    PHQ 2/9 Scores 05/18/2019  PHQ - 2 Score 0  PHQ- 9 Score 0    BP Readings from Last 3 Encounters:  05/18/19 128/82  04/13/19 (!) 160/105  07/22/17 (!) 147/77    Physical Exam  Wt Readings from Last 3 Encounters:  05/18/19 194 lb (88 kg)  04/13/19 192 lb (87.1 kg)  12/03/16 174 lb (78.9 kg)    BP 128/82   Pulse 79   Ht 5\' 8"  (1.727 m)   Wt 194 lb (88 kg)   LMP 05/02/2019 (Exact Date)   SpO2 98%   BMI 29.50 kg/m   Assessment and Plan:  1. Establishing care with new doctor, encounter for Patient establishes care with new physician.  Patient's previous encounters, labs, and imaging were reviewed.  2. 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  Patient was to have return for a repeat MRI after pregnancy.  Patient's had no progression of symptoms however it will be prudent that we refer to endocrinology for further evaluation. - Prolactin - Ambulatory referral to Endocrinology  3. Secondary oligomenorrhea  Patient has a history of oligo menorrhea and will be referred to gynecology for evaluation.  We will be checking a prolactinemia lab as well at this time we will check a TSH as a secondary cause to oligomenorrhea. - Ambulatory referral to Gynecology - TSH  4. Family history of familial combined hyperlipidemia There is a family history of hyperlipidemia and we will check a fasting lipid panel. - Lipid Panel With LDL/HDL Ratio  5. History of gestational diabetes Patient had a history of gestational diabetes and we will check a renal function panel to evaluate fasting glucose as well as other electrolytes. - Renal Function Panel

## 2019-05-20 LAB — RENAL FUNCTION PANEL
Albumin: 4 g/dL (ref 3.8–4.8)
BUN/Creatinine Ratio: 20 (ref 9–23)
BUN: 13 mg/dL (ref 6–24)
CO2: 24 mmol/L (ref 20–29)
Calcium: 9 mg/dL (ref 8.7–10.2)
Chloride: 104 mmol/L (ref 96–106)
Creatinine, Ser: 0.64 mg/dL (ref 0.57–1.00)
GFR calc Af Amer: 126 mL/min/{1.73_m2} (ref >59)
GFR calc non Af Amer: 110 mL/min/{1.73_m2} (ref >59)
Glucose: 104 mg/dL — ABNORMAL HIGH (ref 65–99)
Phosphorus: 3.4 mg/dL (ref 3.0–4.3)
Potassium: 4.2 mmol/L (ref 3.5–5.2)
Sodium: 141 mmol/L (ref 134–144)

## 2019-05-20 LAB — LIPID PANEL WITH LDL/HDL RATIO
Cholesterol, Total: 155 mg/dL (ref 100–199)
HDL: 53 mg/dL (ref >39)
LDL Chol Calc (NIH): 92 mg/dL (ref 0–99)
LDL/HDL Ratio: 1.7 ratio (ref 0.0–3.2)
Triglycerides: 47 mg/dL (ref 0–149)
VLDL Cholesterol Cal: 10 mg/dL (ref 5–40)

## 2019-05-20 LAB — PROLACTIN: Prolactin: 50.3 ng/mL — ABNORMAL HIGH (ref 4.8–23.3)

## 2019-05-20 LAB — TSH: TSH: 1.32 u[IU]/mL (ref 0.450–4.500)

## 2019-07-02 ENCOUNTER — Other Ambulatory Visit: Payer: Self-pay | Admitting: "Endocrinology

## 2019-07-02 ENCOUNTER — Telehealth: Payer: Self-pay | Admitting: *Deleted

## 2019-07-02 DIAGNOSIS — D497 Neoplasm of unspecified behavior of endocrine glands and other parts of nervous system: Secondary | ICD-10-CM

## 2019-07-08 ENCOUNTER — Other Ambulatory Visit: Payer: Self-pay | Admitting: Obstetrics and Gynecology

## 2019-07-08 DIAGNOSIS — Z1231 Encounter for screening mammogram for malignant neoplasm of breast: Secondary | ICD-10-CM

## 2019-07-14 ENCOUNTER — Ambulatory Visit
Admission: RE | Admit: 2019-07-14 | Discharge: 2019-07-14 | Disposition: A | Discharge status: Home / Self Care | Payer: BC Managed Care – PPO | Source: Ambulatory Visit | Attending: Obstetrics and Gynecology | Admitting: Obstetrics and Gynecology

## 2019-07-14 ENCOUNTER — Other Ambulatory Visit: Payer: Self-pay

## 2019-07-14 DIAGNOSIS — Z1231 Encounter for screening mammogram for malignant neoplasm of breast: Secondary | ICD-10-CM | POA: Diagnosis not present

## 2019-07-15 ENCOUNTER — Inpatient Hospital Stay
Admission: RE | Admit: 2019-07-15 | Discharge: 2019-07-15 | Disposition: A | Discharge status: Home / Self Care | Payer: Self-pay | Source: Ambulatory Visit | Attending: *Deleted | Admitting: *Deleted

## 2019-07-15 ENCOUNTER — Other Ambulatory Visit: Payer: Self-pay | Admitting: *Deleted

## 2019-07-15 DIAGNOSIS — Z1231 Encounter for screening mammogram for malignant neoplasm of breast: Secondary | ICD-10-CM

## 2019-07-20 ENCOUNTER — Other Ambulatory Visit: Payer: Self-pay | Admitting: Obstetrics and Gynecology

## 2019-07-20 DIAGNOSIS — N6489 Other specified disorders of breast: Secondary | ICD-10-CM

## 2019-07-20 DIAGNOSIS — R928 Other abnormal and inconclusive findings on diagnostic imaging of breast: Secondary | ICD-10-CM

## 2019-07-21 ENCOUNTER — Ambulatory Visit
Admission: RE | Admit: 2019-07-21 | Discharge: 2019-07-21 | Disposition: A | Discharge status: Home / Self Care | Payer: BC Managed Care – PPO | Source: Ambulatory Visit | Attending: "Endocrinology | Admitting: "Endocrinology

## 2019-07-21 ENCOUNTER — Other Ambulatory Visit: Payer: Self-pay

## 2019-07-21 DIAGNOSIS — D497 Neoplasm of unspecified behavior of endocrine glands and other parts of nervous system: Secondary | ICD-10-CM | POA: Diagnosis not present

## 2019-07-21 MED ORDER — GADOBUTROL 1 MMOL/ML IV SOLN
8.0000 mL | Freq: Once | INTRAVENOUS | Status: AC | PRN
  Administered 2019-07-21: 20:00:00 8 mL via INTRAVENOUS

## 2019-08-03 ENCOUNTER — Other Ambulatory Visit: Payer: BC Managed Care – PPO

## 2019-08-03 ENCOUNTER — Ambulatory Visit: Payer: BC Managed Care – PPO

## 2019-08-10 ENCOUNTER — Ambulatory Visit
Admission: RE | Admit: 2019-08-10 | Discharge: 2019-08-10 | Disposition: A | Discharge status: Home / Self Care | Payer: BC Managed Care – PPO | Source: Ambulatory Visit | Attending: Obstetrics and Gynecology | Admitting: Obstetrics and Gynecology

## 2019-08-10 DIAGNOSIS — N6489 Other specified disorders of breast: Secondary | ICD-10-CM

## 2019-08-10 DIAGNOSIS — R928 Other abnormal and inconclusive findings on diagnostic imaging of breast: Secondary | ICD-10-CM | POA: Diagnosis present

## 2020-01-23 ENCOUNTER — Ambulatory Visit
Admission: EM | Admit: 2020-01-23 | Discharge: 2020-01-23 | Disposition: A | Discharge status: Home / Self Care | Payer: Medicaid Other | Attending: Emergency Medicine | Admitting: Emergency Medicine

## 2020-01-23 ENCOUNTER — Other Ambulatory Visit: Payer: Self-pay

## 2020-01-23 DIAGNOSIS — N923 Ovulation bleeding: Secondary | ICD-10-CM | POA: Diagnosis not present

## 2020-01-23 LAB — URINALYSIS, COMPLETE (UACMP) WITH MICROSCOPIC
Bilirubin Urine: NEGATIVE
Glucose, UA: NEGATIVE mg/dL
Ketones, ur: NEGATIVE mg/dL
Leukocytes,Ua: NEGATIVE
Nitrite: NEGATIVE
Protein, ur: NEGATIVE mg/dL
Specific Gravity, Urine: 1.025 (ref 1.005–1.030)
pH: 7 (ref 5.0–8.0)

## 2020-01-23 LAB — WET PREP, GENITAL
Clue Cells Wet Prep HPF POC: NONE SEEN
Sperm: NONE SEEN
Trich, Wet Prep: NONE SEEN
Yeast Wet Prep HPF POC: NONE SEEN

## 2020-01-23 LAB — CHLAMYDIA/NGC RT PCR (ARMC ONLY)
Chlamydia Tr: NOT DETECTED
N gonorrhoeae: NOT DETECTED

## 2020-01-23 LAB — PREGNANCY, URINE: Preg Test, Ur: NEGATIVE

## 2020-01-23 NOTE — Discharge Instructions (Signed)
You were seen for vaginal discharge and are being treated for the same.   Nothing needs to be treated right now.  Continue to monitor your symptoms and follow-up with your primary care provider for further evaluation.  We will be in touch regarding the results of your gonorrhea and Chlamydia test.  Take care, Dr. Marland Kitchen, NP-c

## 2020-01-23 NOTE — ED Triage Notes (Addendum)
Pt c/o vaginal discharge and odor x 2 weeks, also states she felt "pressure" after urinating. Pt has history of BV, states this is different. Denies new sex partners

## 2020-01-23 NOTE — ED Provider Notes (Signed)
[facility] - [place], [place]   Name: Veronica Berg DOB: October 09, 1975 MRN: [id number] CSN: [id number] PCP: Juline Patch, MD  Arrival date and time:  01/23/20 1320  Chief Complaint:  Vaginal Discharge   NOTE: Prior to seeing the patient today, I have reviewed the triage nursing documentation and vital signs. Clinical staff has updated patient's PMH/PSHx, current medication list, and drug allergies/intolerances to ensure comprehensive history available to assist in medical decision making.   History:   HPI: Veronica Berg is a 44 y.o. female who presents today with complaints of vaginal discharge.  She states she noticed an spotting approximately 3 days ago.  She has her period every 28 days, with her next menses due to start on July 21.  She also complains of an overall fishy odor odor.  She has a history of BV, but she states this instance does not "feel like BV".  She is sexually active 1 partner.  She is also requesting gonorrhea and chlamydia screen during today's visit.   Past Medical History:  Diagnosis Date  . Hypertension     Past Surgical History:  Procedure Laterality Date  . NO PAST SURGERIES      Family History  Problem Relation Age of Onset  . Hypertension Mother   . Cancer Father   . Breast cancer Neg Hx     Social History   Tobacco Use  . Smoking status: Former Research scientist (life sciences)  . Smokeless tobacco: Never Used  Vaping Use  . Vaping Use: Never used  Substance Use Topics  . Alcohol use: Yes    Comment: occasionally  . Drug use: No    There are no problems to display for this patient.   Home Medications:    No outpatient medications have been marked as taking for the 01/23/20 encounter [facility] Encounter).    Allergies:   Patient has no known allergies.  Review of Systems (ROS): Review of Systems  Constitutional: Negative for appetite change, fatigue and fever.  Genitourinary: Positive for vaginal discharge. Negative for difficulty  urinating, dysuria, flank pain, pelvic pain, urgency, vaginal bleeding and vaginal pain.     Vital Signs: Today's Vitals   01/23/20 1348 01/23/20 1441  BP: (!) 151/100   Pulse: 74   Resp: 18   Temp: 98.5 F (36.9 C)   SpO2: 100%   PainSc: 0-No pain 0-No pain    Physical Exam: Physical Exam Vitals and nursing note reviewed.  Constitutional:      Appearance: Normal appearance.  Cardiovascular:     Rate and Rhythm: Normal rate and regular rhythm.     Pulses: Normal pulses.     Heart sounds: Normal heart sounds.  Pulmonary:     Breath sounds: Normal breath sounds.  Skin:    General: Skin is warm and dry.  Neurological:     Mental Status: She is alert.  Psychiatric:        Mood and Affect: Mood normal.        Behavior: Behavior normal.        Thought Content: Thought content normal.      Urgent Care Treatments / Results:   LABS: PLEASE NOTE: all labs that were ordered this encounter are listed, however only abnormal results are displayed. Labs Reviewed  WET PREP, GENITAL - Abnormal; Notable for the following components:      Result Value   WBC, Wet Prep HPF POC FEW (*)    All other components within normal limits  URINALYSIS, COMPLETE (UACMP) WITH MICROSCOPIC - Abnormal; Notable for the following components:   Hgb urine dipstick TRACE (*)    Bacteria, UA MANY (*)    All other components within normal limits  CHLAMYDIA/NGC RT PCR ([facility] ONLY)  PREGNANCY, URINE    EKG: -None  RADIOLOGY: No results found.  PROCEDURES: Procedures  MEDICATIONS RECEIVED THIS VISIT: Medications - No data to display  PERTINENT CLINICAL COURSE NOTES/UPDATES:   Initial Impression / Assessment and Plan / Urgent Care Course:  Pertinent labs & imaging results that were available during my care of the patient were personally reviewed by me and considered in my medical decision making (see lab/imaging section of note for values and interpretations).  Veronica Berg is a  44 y.o. female who presents to [facility] today with complaints of vaginal discharge, diagnosed with spotting, and treated as such with the directions below. NP and patient reviewed discharge instructions below during visit.   Patient is well appearing overall in clinic today. She does not appear to be in any acute distress. Presenting symptoms (see HPI) and exam as documented above.   I have reviewed the follow up and strict return precautions for any new or worsening symptoms. Patient is aware of symptoms that would be deemed urgent/emergent, and would thus require further evaluation either here or in the emergency department. At the time of discharge, she verbalized understanding and consent with the discharge plan as it was reviewed with her. All questions were fielded by provider and/or clinic staff prior to patient discharge.    Final Clinical Impressions / Urgent Care Diagnoses:   Final diagnoses:  Spotting between menses    New Prescriptions:  [place] Controlled Substance Registry consulted? Not Applicable  No orders of the defined types were placed in this encounter.     Discharge Instructions     You were seen for vaginal discharge and are being treated for the same.   Nothing needs to be treated right now.  Continue to monitor your symptoms and follow-up with your primary care provider for further evaluation.  We will be in touch regarding the results of your gonorrhea and Chlamydia test.  Take care, Dr. Marland Kitchen, NP-c     Recommended Follow up Care:  Patient encouraged to follow up with the following provider within the specified time frame, or sooner as dictated by the severity of her symptoms. As always, she was instructed that for any urgent/emergent care needs, she should seek care either here or in the emergency department for more immediate evaluation.   Gertie Baron, DNP, NP-c    Gertie Baron, NP 01/23/20 [phone]

## 2020-06-03 DIAGNOSIS — D497 Neoplasm of unspecified behavior of endocrine glands and other parts of nervous system: Secondary | ICD-10-CM | POA: Diagnosis not present

## 2020-06-03 DIAGNOSIS — E221 Hyperprolactinemia: Secondary | ICD-10-CM | POA: Diagnosis not present

## 2021-07-31 DIAGNOSIS — D497 Neoplasm of unspecified behavior of endocrine glands and other parts of nervous system: Secondary | ICD-10-CM | POA: Diagnosis not present

## 2021-07-31 DIAGNOSIS — E221 Hyperprolactinemia: Secondary | ICD-10-CM | POA: Diagnosis not present

## 2021-08-29 DIAGNOSIS — Z124 Encounter for screening for malignant neoplasm of cervix: Secondary | ICD-10-CM | POA: Diagnosis not present

## 2021-08-29 DIAGNOSIS — Z1231 Encounter for screening mammogram for malignant neoplasm of breast: Secondary | ICD-10-CM | POA: Diagnosis not present

## 2021-08-29 DIAGNOSIS — Z1151 Encounter for screening for human papillomavirus (HPV): Secondary | ICD-10-CM | POA: Diagnosis not present

## 2021-08-29 DIAGNOSIS — Z01419 Encounter for gynecological examination (general) (routine) without abnormal findings: Secondary | ICD-10-CM | POA: Diagnosis not present

## 2021-08-29 LAB — RESULTS CONSOLE HPV: CHL HPV: NEGATIVE

## 2021-08-29 LAB — HM PAP SMEAR

## 2021-08-30 ENCOUNTER — Other Ambulatory Visit: Payer: Self-pay

## 2021-08-30 DIAGNOSIS — Z1231 Encounter for screening mammogram for malignant neoplasm of breast: Secondary | ICD-10-CM

## 2021-09-14 ENCOUNTER — Encounter: Payer: Self-pay | Admitting: Family Medicine

## 2021-09-14 ENCOUNTER — Other Ambulatory Visit: Payer: Self-pay

## 2021-09-14 ENCOUNTER — Ambulatory Visit: Payer: Medicaid Other | Admitting: Family Medicine

## 2021-09-14 VITALS — BP 122/82 | HR 62 | Ht 68.0 in | Wt 184.0 lb

## 2021-09-14 DIAGNOSIS — E785 Hyperlipidemia, unspecified: Secondary | ICD-10-CM

## 2021-09-14 DIAGNOSIS — Z862 Personal history of diseases of the blood and blood-forming organs and certain disorders involving the immune mechanism: Secondary | ICD-10-CM | POA: Diagnosis not present

## 2021-09-14 DIAGNOSIS — B360 Pityriasis versicolor: Secondary | ICD-10-CM

## 2021-09-14 DIAGNOSIS — Z1211 Encounter for screening for malignant neoplasm of colon: Secondary | ICD-10-CM

## 2021-09-14 DIAGNOSIS — R739 Hyperglycemia, unspecified: Secondary | ICD-10-CM | POA: Diagnosis not present

## 2021-09-14 MED ORDER — KETOCONAZOLE 2 % EX SHAM: 1.0000 "application " | MEDICATED_SHAMPOO | CUTANEOUS | 0 refills | Status: DC

## 2021-09-14 NOTE — Progress Notes (Signed)
Date:  09/14/2021   Name:  Veronica Berg   DOB:  December 18, 1975   MRN:  [id number]   Chief Complaint: Annual Exam  Patient is a 46 year old female who presents for a comprehensive physical exam and labwork. The patient reports the following problems: gestional diabetes/lipid concern. Health maintenance has been reviewed needs colonoscopy.     Lab Results  Component Value Date   NA 141 05/19/2019   K 4.2 05/19/2019   CO2 24 05/19/2019   GLUCOSE 104 (H) 05/19/2019   BUN 13 05/19/2019   CREATININE 0.64 05/19/2019   CALCIUM 9.0 05/19/2019   GFRNONAA 110 05/19/2019   Lab Results  Component Value Date   CHOL 155 05/19/2019   HDL 53 05/19/2019   LDLCALC 92 05/19/2019   TRIG 47 05/19/2019   Lab Results  Component Value Date   TSH 1.320 05/19/2019   No results found for: HGBA1C Lab Results  Component Value Date   WBC 6.9 07/19/2015   HGB 12.5 07/19/2015   HCT 37.4 07/19/2015   MCV 91.6 07/19/2015   PLT 258 07/19/2015   No results found for: ALT, AST, GGT, ALKPHOS, BILITOT No results found for: 25OHVITD2, 25OHVITD3, VD25OH   Review of Systems  Constitutional:  Negative for chills and fever.  HENT:  Negative for drooling, ear discharge, ear pain and sore throat.   Respiratory:  Negative for cough, shortness of breath and wheezing.   Cardiovascular:  Negative for chest pain, palpitations and leg swelling.       History Raynauds  Gastrointestinal:  Negative for abdominal pain, blood in stool, constipation, diarrhea and nausea.  Endocrine: Negative for polydipsia.  Genitourinary:  Negative for dysuria, frequency, hematuria and urgency.  Musculoskeletal:  Negative for back pain, myalgias and neck pain.  Skin:  Positive for rash.  Allergic/Immunologic: Negative for environmental allergies.  Neurological:  Negative for dizziness and headaches.  Hematological:  Does not bruise/bleed easily.  Psychiatric/Behavioral:  Negative for suicidal ideas. The patient is not  nervous/anxious.    There are no problems to display for this patient.   No Known Allergies  Past Surgical History:  Procedure Laterality Date   NO PAST SURGERIES      Social History   Tobacco Use   Smoking status: Former   Smokeless tobacco: Never  Vaping Use   Vaping Use: Never used  Substance Use Topics   Alcohol use: Yes    Comment: occasionally   Drug use: No     Medication list has been reviewed and updated.  Current Meds  Medication Sig   b complex vitamins tablet Take 1 tablet by mouth daily.   Cholecalciferol (VITAMIN D3 PO) Take by mouth.    PHQ 2/9 Scores 09/14/2021 05/18/2019  PHQ - 2 Score 0 0  PHQ- 9 Score 0 0    GAD 7 : Generalized Anxiety Score 09/14/2021 05/18/2019  Nervous, Anxious, on Edge 0 0  Control/stop worrying 0 0  Worry too much - different things 0 0  Trouble relaxing 0 0  Restless 0 0  Easily annoyed or irritable 0 0  Afraid - awful might happen 0 0  Total GAD 7 Score 0 0  Anxiety Difficulty Not difficult at all -    BP Readings from Last 3 Encounters:  09/14/21 122/82  01/23/20 (!) 151/100  05/18/19 128/82    Physical Exam Vitals and nursing note reviewed.  Constitutional:      Appearance: She is well-developed, well-groomed and normal weight.  HENT:     Head: Normocephalic.     Jaw: There is normal jaw occlusion.     Right Ear: Hearing, tympanic membrane, ear canal and external ear normal.     Left Ear: Hearing, tympanic membrane, ear canal and external ear normal.     Nose: Nose normal.     Mouth/Throat:     Lips: Pink.     Mouth: Mucous membranes are moist.     Palate: No mass.  Eyes:     General: Lids are normal. Vision grossly intact. Gaze aligned appropriately. No scleral icterus.    Extraocular Movements: Extraocular movements intact.     Conjunctiva/sclera: Conjunctivae normal.     Right eye: Right conjunctiva is not injected.     Left eye: Left conjunctiva is not injected.     Pupils: Pupils are  equal, round, and reactive to light.     Funduscopic exam:    Right eye: Red reflex present.        Left eye: Red reflex present. Neck:     Thyroid: No thyroid mass, thyromegaly or thyroid tenderness.     Vascular: Normal carotid pulses. No carotid bruit, hepatojugular reflux or JVD.     Trachea: Trachea and phonation normal. No tracheal deviation.  Cardiovascular:     Rate and Rhythm: Normal rate and regular rhythm.     Chest Wall: PMI is not displaced.     Pulses: Normal pulses.     Heart sounds: Normal heart sounds, S1 normal and S2 normal. No murmur heard. No systolic murmur is present.  No diastolic murmur is present.    No friction rub. No gallop. No S3 or S4 sounds.  Pulmonary:     Effort: Pulmonary effort is normal. No respiratory distress.     Breath sounds: Normal breath sounds. No decreased breath sounds, wheezing, rhonchi or rales.  Chest:     Chest wall: No mass.  Breasts:    Right: Normal. No mass.     Left: Normal. No mass.  Abdominal:     General: Bowel sounds are normal.     Palpations: Abdomen is soft. There is no hepatomegaly, splenomegaly or mass.     Tenderness: There is no abdominal tenderness. There is no guarding or rebound.     Hernia: There is no hernia in the umbilical area or ventral area.  Genitourinary:    Rectum: Normal. Guaiac result negative. No mass.  Musculoskeletal:        General: No tenderness. Normal range of motion.     Cervical back: Normal, normal range of motion and neck supple.     Thoracic back: Normal.     Lumbar back: Normal.     Right lower leg: No edema.     Left lower leg: No edema.  Lymphadenopathy:     Head:     Right side of head: No submental adenopathy.     Left side of head: No submental adenopathy.     Cervical: No cervical adenopathy.     Right cervical: No superficial, deep or posterior cervical adenopathy.    Left cervical: No superficial, deep or posterior cervical adenopathy.     Upper Body:     Right upper  body: No supraclavicular, axillary or pectoral adenopathy.     Left upper body: No supraclavicular, axillary or pectoral adenopathy.  Skin:    General: Skin is warm.     Findings: Rash present. Rash is macular.  Neurological:     Mental  Status: She is alert and oriented to person, place, and time.     Cranial Nerves: Cranial nerves 2-12 are intact. No cranial nerve deficit.     Sensory: Sensation is intact.     Motor: Motor function is intact.     Deep Tendon Reflexes: Reflexes normal.  Psychiatric:        Mood and Affect: Mood is not anxious or depressed.        Behavior: Behavior is cooperative.    Wt Readings from Last 3 Encounters:  09/14/21 184 lb (83.5 kg)  05/18/19 194 lb (88 kg)  04/13/19 192 lb (87.1 kg)    BP 122/82    Pulse 62    Ht 5\' 8"  (1.727 m)    Wt 184 lb (83.5 kg)    SpO2 99%    BMI 27.98 kg/m   Assessment and Plan:  Veronica Berg is a 46 y.o. female who presents today for her medical exam with labs. She feels well. She reports exercising . She reports she is sleeping fairly well.  Patient's chart was reviewed for previous encounters most recent labs most recent imaging in [place]. 1. Dyslipidemia Patient with remote history of dyslipidemia which is mild but has not been rechecked in quite duration and we will check lipid panel with ratio today. - Lipid Panel With LDL/HDL Ratio  2. Hyperglycemia Patient has history of elevated glucose but also gestational diabetes.  We will check renal function panel for GFR and electrolytes and check A1c. - Renal Function Panel - Hemoglobin A1c  3. Tinea versicolor Chronic.  Persistent.  There is a macular slightly scaly rash on the back and hypopigmentation of the torso in front.  This is likely tinea versicolor and patient has been scribed ketoconazole shampoo to be used in the shower. - ketoconazole (NIZORAL) 2 % shampoo; Apply 1 application topically 2 (two) times a week.  Dispense: 120 mL; Refill: 0  4. Colon  cancer screening Discussed with patient and referral made to gastroenterology for screening colonoscopy. - Ambulatory referral to Gastroenterology  5. History of anemia Patient has a remote history of decreased RBC count and we will check CBC for review of anemia. - CBC with Differential/Platelet

## 2021-09-14 NOTE — Progress Notes (Deleted)
? ? ?  Date:  09/14/2021  ? ?Name:  Veronica Berg   DOB:  11-14-1975   MRN:  [id number] ? ? ?Chief Complaint: No chief complaint on file. ? ?HPI ? ?Lab Results  ?Component Value Date  ? NA 141 05/19/2019  ? K 4.2 05/19/2019  ? CO2 24 05/19/2019  ? GLUCOSE 104 (H) 05/19/2019  ? BUN 13 05/19/2019  ? CREATININE 0.64 05/19/2019  ? CALCIUM 9.0 05/19/2019  ? GFRNONAA 110 05/19/2019  ? ?Lab Results  ?Component Value Date  ? CHOL 155 05/19/2019  ? HDL 53 05/19/2019  ? [place] 92 05/19/2019  ? TRIG 47 05/19/2019  ? ?Lab Results  ?Component Value Date  ? TSH 1.320 05/19/2019  ? ?No results found for: HGBA1C ?Lab Results  ?Component Value Date  ? WBC 6.9 07/19/2015  ? HGB 12.5 07/19/2015  ? HCT 37.4 07/19/2015  ? MCV 91.6 07/19/2015  ? PLT 258 07/19/2015  ? ?No results found for: ALT, AST, GGT, ALKPHOS, BILITOT ?No results found for: 25OHVITD2, [place], VD25OH  ? ?Review of Systems ? ?There are no problems to display for this patient. ? ? ?No Known Allergies ? ?Past Surgical History:  ?Procedure Laterality Date  ? NO PAST SURGERIES    ? ? ?Social History  ? ?Tobacco Use  ? Smoking status: Former  ? Smokeless tobacco: Never  ?Vaping Use  ? Vaping Use: Never used  ?Substance Use Topics  ? Alcohol use: Yes  ?  Comment: occasionally  ? Drug use: No  ? ? ? ?Medication list has been reviewed and updated. ? ?No outpatient medications have been marked as taking for the 09/14/21 encounter (Office Visit) with Juline Patch, MD.  ? ? ?PHQ 2/9 Scores 05/18/2019  ?PHQ - 2 Score 0  ?PHQ- 9 Score 0  ? ? ?GAD 7 : Generalized Anxiety Score 05/18/2019  ?Nervous, Anxious, on Edge 0  ?Control/stop worrying 0  ?Worry too much - different things 0  ?Trouble relaxing 0  ?Restless 0  ?Easily annoyed or irritable 0  ?Afraid - awful might happen 0  ?Total GAD 7 Score 0  ? ? ?BP Readings from Last 3 Encounters:  ?01/23/20 (!) 151/100  ?05/18/19 128/82  ?04/13/19 (!) 160/105  ? ? ?Physical Exam ? ?Wt Readings from Last 3 Encounters:  ?05/18/19 194 lb (88  kg)  ?04/13/19 192 lb (87.1 kg)  ?12/03/16 174 lb (78.9 kg)  ? ? ?Ht 5\' 8"  (1.727 m)   BMI 29.50 kg/m?  ? ?Assessment and Plan: ? ? ? ? ?

## 2021-09-14 NOTE — Patient Instructions (Signed)
Raynaud's Phenomenon Raynaud's phenomenon is a condition that affects the blood vessels (arteries) that carry blood to the fingers and toes. The arteries that supply blood to the ears, lips, nipples, or the tip of the nose might also be affected. Raynaud's phenomenon causes the arteries to become narrow temporarily (spasm). As a result, the flow of blood to the affected areas is temporarily decreased. This usually occurs in response to cold temperatures or stress. During an attack, the skin in the affected areas turns white, then blue, and finally red. A person may also feel tingling or numbness in those areas. Attacks usually last for only a brief period, and then the blood flow to the area returns to normal. In most cases, Raynaud's phenomenon does not cause serious health problems. What are the causes? In many cases, the cause of this condition is not known. The condition may occur on its own (primary Raynaud's phenomenon) or may be associated with other diseases or factors (secondary Raynaud's phenomenon). Possible causes may include: Diseases or medical conditions that damage the arteries. Injuries and repetitive actions that hurt the hands or feet. Being exposed to certain chemicals. Taking medicines that narrow the arteries. Other medical conditions, such as lupus, scleroderma, rheumatoid arthritis, thyroid problems, blood disorders, Sjogren syndrome, or atherosclerosis. What increases the risk? The following factors may make you more likely to develop this condition: Being 72-47 years old. Being female. Having a family history of Raynaud's phenomenon. Living in a cold climate. Smoking. What are the signs or symptoms? Symptoms of this condition usually occur when you are exposed to cold temperatures or when you have emotional stress. The symptoms may last for a few minutes or up to several hours. They usually affect your fingers but may also affect your toes, nipples, lips, ears, or the tip  of your nose. Symptoms may include: Changes in skin color. The skin in the affected areas will turn pale or white. The skin may then change from white to bluish to red as normal blood flow returns to the area. Numbness, tingling, or pain in the affected areas. In severe cases, symptoms may include: Skin sores. Tissues decaying and dying (gangrene). How is this diagnosed? This condition may be diagnosed based on: Your symptoms and medical history. A physical exam. During the exam, you may be asked to put your hands in cold water to check for a reaction to cold temperature. Tests, such as: Blood tests to check for other diseases or conditions. A test to check the movement of blood through your arteries and veins (vascular ultrasound). A test in which the skin at the base of your fingernail is examined under a microscope (nailfold capillaroscopy). How is this treated? During an episode, you can take actions to help symptoms go away faster. Options include moving your arms around in a windmill pattern, warming your fingers under warm water, or placing your fingers in a warm body fold, such as your armpit. Long-term treatment for this condition often involves making lifestyle changes and taking steps to control your exposure to cold temperature. For more severe cases, medicine (calcium channel blockers) may be used to improve blood circulation. Follow these instructions at home: Avoiding cold temperatures Take these steps to avoid exposure to cold: If possible, stay indoors during cold weather. When you go outside during cold weather, dress in layers and wear mittens, a hat, a scarf, and warm footwear. Wear mittens or gloves when handling ice or frozen food. Use holders for glasses or cans containing cold  drinks. Let warm water run for a while before taking a shower or bath. Warm up the car before driving in cold weather. Lifestyle If possible, avoid stressful and emotional situations. Try to  find ways to manage your stress, such as: Exercise. Yoga. Meditation. Biofeedback. Do not use any products that contain nicotine or tobacco. These products include cigarettes, chewing tobacco, and vaping devices, such as e-cigarettes. If you need help quitting, ask your health care provider. Avoid secondhand smoke. Limit your use of caffeine. Switch to decaffeinated coffee, tea, and soda. Avoid chocolate. Avoid vibrating tools and machinery. General instructions Protect your hands and feet from injuries, cuts, or bruises. Avoid wearing tight rings or wristbands. Wear loose fitting socks and comfortable, roomy shoes. Take over-the-counter and prescription medicines only as told by your health care provider. Where to find support Raynaud's Association: www.raynauds.org Where to find more information [employer] of Arthritis and Musculoskeletal and Skin Diseases: www.niams.SouthExposed.es Contact a health care provider if: Your discomfort becomes worse despite lifestyle changes. You develop sores on your fingers or toes that do not heal. You have breaks in the skin on your fingers or toes. You have a fever. You have pain or swelling in your joints. You have a rash. Your symptoms occur on only one side of your body. Get help right away if: Your fingers or toes turn black. You have severe pain in the affected areas. These symptoms may represent a serious problem that is an emergency. Do not wait to see if the symptoms will go away. Get medical help right away. Call your local emergency services (911 in the [place] Do not drive yourself to the hospital. Summary Raynaud's phenomenon is a condition that affects the arteries that carry blood to the fingers, toes, ears, lips, nipples, or the tip of the nose. In many cases, the cause of this condition is not known. Symptoms of this condition include changes in skin color along with numbness and tingling in the affected area. Treatment for this  condition includes lifestyle changes and reducing exposure to cold temperatures. Medicines may be used for severe cases of the condition. Contact your health care provider if your condition worsens despite treatment. This information is not intended to replace advice given to you by your health care provider. Make sure you discuss any questions you have with your health care provider. Document Revised: 09/06/2020 Document Reviewed: 09/06/2020 Elsevier Patient Education  2022 [place] versicolor is a common fungal infection. It causes a rash that looks like light or dark patches on the skin. The rash most often occurs on the chest, back, neck, or upper arms. This condition is more common during warm weather. Tinea versicolor usually does not cause any other problems than the rash. In most cases, the infection goes away in a few weeks with treatment. It may take a few months for the patches on your skin to return to your usual skin color. What are the causes? This condition occurs when a certain type of fungus (Malassezia furfur) that is normally present on the skin starts to grow too much. This fungus is a type of yeast. This condition cannot be passed from one person to another (is not contagious). What increases the risk? This condition is more likely to develop when certain factors are present, such as: Heat and humidity. Sweating too much. Hormone changes, such as those that occur when taking birth control pills. Oily skin. A weak disease-fighting system (immunesystem). What are the signs or  symptoms? Symptoms of this condition include: A rash of light or dark patches on your skin. The rash may have: Patches of tan or pink spots (on light skin). Patches of white or brown spots (on dark skin). Patches of skin that do not tan. Well-marked edges. Scales on the discolored areas. Mild itching. There may also be no itching. How is this diagnosed? A health care  provider can usually diagnose this condition by looking at your skin. During the exam, he or she may use ultraviolet (UV) light to see how much of your skin has been affected. In some cases, a skin sample may be taken by scraping the rash. This sample will be viewed under a microscope to check for yeast overgrowth. How is this treated? Treatment for this condition may include: Dandruff shampoo that is applied to the affected skin during showers or bathing. Over-the-counter medicated skin cream, lotion, or soaps. Prescription antifungal medicine in the form of skin cream or pills. Medicine to help reduce itching. Follow these instructions at home: Use over-the-counter and prescription medicines only as told by your health care provider. Apply dandruff shampoo to the affected area as told by your health care provider. Do not scratch the affected area of skin. Avoid hot and humid conditions. Do not use tanning booths. Try to avoid sweating a lot. Contact a health care provider if: Your symptoms get worse. You have a fever. You have signs of infection such as: Redness, swelling, or pain at the site of your rash. Warmth coming from your rash. Fluid or blood coming from your rash. Pus or a bad smell coming from your rash. Your rash comes back (recurs) after treatment. Your rash does not improve with treatment and spreads to other parts of the body. Summary Tinea versicolor is a common fungal infection of the skin. It causes a rash that looks like light or dark patches on the skin. The rash most often occurs on the chest, back, neck, or upper arms. A health care provider can usually diagnose this condition by looking at your skin. Treatment may include applying shampoo to the skin and taking or applying medicines. This information is not intended to replace advice given to you by your health care provider. Make sure you discuss any questions you have with your health care provider. Document  Revised: 09/20/2020 Document Reviewed: 09/20/2020 Elsevier Patient Education  2022 [place]  LOW-CHOLESTEROL, LOW-TRIGLYCERIDE DIETS    FOODS TO USE   MEATS, FISH Choose lean meats (chicken, [place], veal, and non-fatty cuts of beef with excess fat trimmed; one serving = 3 oz of cooked meat). Also, fresh or frozen fish, canned fish packed in water, and shellfish (lobster, crabs, shrimp, and oysters). Limit use to no more than one serving of one of these per week. Shellfish are high in cholesterol but low in saturated fat and should be used sparingly. Meats and fish should be broiled (pan or oven) or baked on a rack.  EGGS Egg substitutes and egg whites (use freely). Egg yolks (limit two per week).  FRUITS Eat three servings of fresh fruit per day (1 serving =  cup). Be sure to have at least one citrus fruit daily. Frozen and canned fruit with no sugar or syrup added may be used.  VEGETABLES Most vegetables are not limited (see next page). One dark-green (string beans, escarole) or one deep yellow (squash) vegetable is recommended daily. Cauliflower, broccoli, and celery, as well as potato skins, are recommended for their fiber  content. (Fiber is associated with cholesterol reduction) It is preferable to steam vegetables, but they may be boiled, strained, or braised with polyunsaturated vegetable oil (see below).  BEANS Dried peas or beans (1 serving =  cup) may be used as a bread substitute.  NUTS Almonds, walnuts, and peanuts may be used sparingly  (1 serving = 1 Tablespoonful). Use pumpkin, sesame, or sunflower seeds.  BREADS, GRAINS One roll or one slice of whole grain or enriched bread may be used, or three soda crackers or four pieces of melba toast as a substitute. Spaghetti, rice or noodles ( cup) or  large ear of corn may be used as a bread substitute. In preparing these foods do not use butter or shortening, use soft margarine. Also use egg and sugar substitutes.   Choose high fiber grains, such as oats and whole wheat.  CEREALS Use  cup of hot cereal or  cup of cold cereal per day. Add a sugar substitute if desired, with 99% fat free or skim milk.  MILK PRODUCTS Always use 99% fat free or skim milk, dairy products such as low fat cheeses (farmer's uncreamed diet cottage), low-fat yogurt, and powdered skim milk.  FATS, OILS Use soft (not stick) margarine; vegetable oils that are high in polyunsaturated fats (such as safflower, sunflower, soybean, corn, and cottonseed). Always refrigerate meat drippings to harden the fat and remove it before preparing gravies  DESSERTS, SNACKS Limit to two servings per day; substitute each serving for a bread/cereal serving: ice milk, water sherbet (1/4 cup); unflavored gelatin or gelatin flavored with sugar substitute (1/3 cup); pudding prepared with skim milk (1/2 cup); egg white souffls; unbuttered popcorn (1  cups). Substitute carob for chocolate.  BEVERAGES Fresh fruit juices (limit 4 oz per day); black coffee, plain or herbal teas; soft drinks with sugar substitutes; club soda, preferably salt-free; cocoa made with skim milk or nonfat dried milk and water (sugar substitute added if desired); clear broth. Alcohol: limit two servings per day (see second page).  MISCELLANEOUS  You may use the following freely: vinegar, spices, herbs, nonfat bouillon, mustard, Worcestershire sauce, soy sauce, flavoring essence.                  GUIDELINES FOR  LOW-CHOLESTEROL, LOW TRIGLYCERIDE DIETS    FOODS TO AVOID   MEATS, FISH Marbled beef, pork, bacon, sausage, and other pork products; fatty fowl (duck, goose); skin and fat of [place] and chicken; processed meats; luncheon meats (salami, bologna); frankfurters and fast-food hamburgers (theyre loaded with fat); organ meats (kidneys, liver); canned fish packed in oil.  EGGS Limit egg yolks to two per week.   FRUITS Coconuts (rich in saturated fats).  VEGETABLES Avoid  avocados. Starchy vegetables (potatoes, corn, lima beans, dried peas, beans) may be used only if substitutes for a serving of bread or cereal. (Baked potato skin, however, is desirable for its fiber content.  BEANS Commercial baked beans with sugar and/or pork added.  NUTS Avoid nuts.  Limit peanuts and walnuts to one tablespoonful per day.  BREADS, GRAINS Any baked goods with shortening and/or sugar. Commercial mixes with dried eggs and whole milk. Avoid sweet rolls, doughnuts, breakfast pastries (Danish), and sweetened packaged cereals (the added sugar converts readily to triglycerides).  MILK PRODUCTS Whole milk and whole-milk packaged goods; cream; ice cream; whole-milk puddings, yogurt, or cheeses; nondairy cream substitutes.  FATS, OILS Butter, lard, animal fats, bacon drippings, gravies, cream sauces as well as palm and coconut oils. All these are high  in saturated fats. Examine labels on cholesterol free products for hydrogenated fats. (These are oils that have been hardened into solids and in the process have become saturated.)  DESSERTS, SNACKS Fried snack foods like potato chips; chocolate; candies in general; jams, jellies, syrups; whole- milk puddings; ice cream and milk sherbets; hydrogenated peanut butter.  BEVERAGES Sugared fruit juices and soft drinks; cocoa made with whole milk and/or sugar. When using alcohol (1 oz liquor, 5 oz beer, or 2  oz dry table wine per serving), one serving must be substituted for one bread or cereal serving (limit, two servings of alcohol per day).   SPECIAL NOTES    Remember that even non-limited foods should be used in moderation. While on a cholesterol-lowering diet, be sure to avoid animal fats and marbled meats. 3. While on a triglyceride-lowering diet, be sure to avoid sweets and to control the amount of carbohydrates you eat (starchy foods such as flour, bread, potatoes).While on a tri-glyceride-lowering diet, be sure to avoid sweets Buy a good  low-fat cookbook, such as the one published by the American Heart Association. Consult your physician if you have any questions.               [facility] Low Glycemic Diet Plan   Low Glycemic Foods (20-49) Moderate Glycemic Foods (50-69) High Glycemic Foods (70-100)      Breakfast Creals Breakfast Cereals Breakfast Cereals  All Bran All-Bran Fruit'n Oats   Bran Buds Bran Chex   Cheerios Corn chex    Fiber One Oatmeal (not instant)   Just Right Mini-Wheats   Corn Flakes Cream of Wheat    Oat Bran Special K Swiss Muesli   Grape Nuts Grape Nut Flakes      Grits Nutri-Grain    Fruits and fruit juice: Fruits Puffed Rice Puffed Wheat    (Limit to 1-2 Servings per day) Banana (under-ride) Dates   Rice Chex Rice Krispies    Apples Apricots (fresh/dried)   Figs Grapes   Shredded Wheat Team    Blackberries Blueberries   Kiwi Mango   Total     Cherries Cranberries   Oranges Raisins     Peaches Pears    Fruits  Plums Prunes   Fruit Juices Pineapple Watermelon    Grapefruit Raspberries   Cranberry Juice Orange Juice   Banana (over-ripe)     Strawberries Tangerines      Apple Juice Grapefruit Juice   Beans and Legumes Beverages  Tomato Juice    [place]-type baked beans Sodas, sweet tea, pineapple juice   Canned pinto, kidney, or navy beans   Beans and Legumes (fresh-cooked) Green peas Vegetables  Black-eyed peas Butter Beans    Potato, baked, boiled, fried, mashed  Chick peas Lentils   Vegetables [place] fries  Green beans Lima beans   Beets Carrots   Canned or frozen corn  Kidney beans Navy beans   Sweet potato Yam   Parsnips  Pinto beans Snow peas   Corn on the cob Winter squash      Non-starchy vegetables Grains Breads  Asparagus, avocado, broccoli, cabbage Cornmeal Rice, brown   Most breads (white and whole grain)  cauliflower, celery, cucumber, greens Rice, white Couscous   Bagels Bread sticks    lettuce, mushrooms, peppers, tomatoes   Bread stuffing Kaiser roll    okra, onions, spinach, summer squash Pasta Dinner rolls   [employer], cheese     Grains Ravioli, meat filled Spaghetti, white   Grains  Barley Bulgur    Rice, instant Tapioca, with milk    Rye Wild rice   Nuts    Cashews Macadamia   Candy and most cookies  Nuts and oils    Almonds, peanuts, sunflower seeds Snacks Snacks  hazelnuts, pecans, walnuts Chocolate Ice cream, lowfat   Donuts Corn chips    Oils that are liquid at room temperature Muffin Popcorn   Jelly beans Pretzels      Pastries  Dairy, fish, meat, soy, and eggs    Milk, skim Lowfat cheese    Restaurant and ethnic foods  Yogurt, lowfat, fruit sugar sweetened  Most [place] food (sugar in stir fry    or wok sauce)  Lean red meat Fish    Teriyaki-style meats and vegetables  Skinless chicken and [place], shellfish        Egg whites (up to 3 daily), Soy Products    Egg yolks (up to 7 or _____ per week)

## 2021-09-15 LAB — RENAL FUNCTION PANEL
Albumin: 4.4 g/dL (ref 3.8–4.8)
BUN/Creatinine Ratio: 16 (ref 9–23)
BUN: 13 mg/dL (ref 6–24)
CO2: 22 mmol/L (ref 20–29)
Calcium: 8.9 mg/dL (ref 8.7–10.2)
Chloride: 100 mmol/L (ref 96–106)
Creatinine, Ser: 0.79 mg/dL (ref 0.57–1.00)
Glucose: 92 mg/dL (ref 70–99)
Phosphorus: 4 mg/dL (ref 3.0–4.3)
Potassium: 4.1 mmol/L (ref 3.5–5.2)
Sodium: 138 mmol/L (ref 134–144)
eGFR: 93 mL/min/{1.73_m2} (ref >59)

## 2021-09-15 LAB — CBC WITH DIFFERENTIAL/PLATELET
Basophils Absolute: 0 10*3/uL (ref 0.0–0.2)
Basos: 0 %
EOS (ABSOLUTE): 0.3 10*3/uL (ref 0.0–0.4)
Eos: 4 %
Hematocrit: 35 % (ref 34.0–46.6)
Hemoglobin: 11.5 g/dL (ref 11.1–15.9)
Immature Grans (Abs): 0 10*3/uL (ref 0.0–0.1)
Immature Granulocytes: 0 %
Lymphocytes Absolute: 1.5 10*3/uL (ref 0.7–3.1)
Lymphs: 21 %
MCH: 29.6 pg (ref 26.6–33.0)
MCHC: 32.9 g/dL (ref 31.5–35.7)
MCV: 90 fL (ref 79–97)
Monocytes Absolute: 0.7 10*3/uL (ref 0.1–0.9)
Monocytes: 10 %
Neutrophils Absolute: 4.8 10*3/uL (ref 1.4–7.0)
Neutrophils: 65 %
Platelets: 276 10*3/uL (ref 150–450)
RBC: 3.88 x10E6/uL (ref 3.77–5.28)
RDW: 12.2 % (ref 11.7–15.4)
WBC: 7.3 10*3/uL (ref 3.4–10.8)

## 2021-09-15 LAB — HEMOGLOBIN A1C
Est. average glucose Bld gHb Est-mCnc: 108 mg/dL
Hgb A1c MFr Bld: 5.4 % (ref 4.8–5.6)

## 2021-09-15 LAB — LIPID PANEL WITH LDL/HDL RATIO
Cholesterol, Total: 171 mg/dL (ref 100–199)
HDL: 43 mg/dL (ref >39)
LDL Chol Calc (NIH): 119 mg/dL — ABNORMAL HIGH (ref 0–99)
LDL/HDL Ratio: 2.8 ratio (ref 0.0–3.2)
Triglycerides: 45 mg/dL (ref 0–149)
VLDL Cholesterol Cal: 9 mg/dL (ref 5–40)

## 2021-09-18 ENCOUNTER — Other Ambulatory Visit: Payer: Self-pay

## 2021-09-18 DIAGNOSIS — Z1211 Encounter for screening for malignant neoplasm of colon: Secondary | ICD-10-CM

## 2021-09-18 MED ORDER — CLENPIQ 10-3.5-12 MG-GM -GM/160ML PO SOLN: 1.0000 | Freq: Once | ORAL | 0 refills | Status: AC

## 2021-09-18 NOTE — Progress Notes (Signed)
Gastroenterology Pre-Procedure Review ? ?Request Date: 11/10/2021 ?Requesting Physician: Dr. Allen Norris ? ?PATIENT REVIEW QUESTIONS: The patient responded to the following health history questions as indicated:   ? ?1. Are you having any GI issues? no ?2. Do you have a personal history of Polyps? no ?3. Do you have a family history of Colon Cancer or Polyps? no ?4. Diabetes Mellitus? no ?5. Joint replacements in the past 12 months?no ?6. Major health problems in the past 3 months?no ?7. Any artificial heart valves, MVP, or defibrillator?no ?   ?MEDICATIONS & ALLERGIES:    ?Patient reports the following regarding taking any anticoagulation/antiplatelet therapy:   ?Plavix, Coumadin, Eliquis, Xarelto, Lovenox, Pradaxa, Brilinta, or Effient? no ?Aspirin? no ? ?Patient confirms/reports the following medications:  ?Current Outpatient Medications  ?Medication Sig Dispense Refill  ? b complex vitamins tablet Take 1 tablet by mouth daily.    ? Cholecalciferol (VITAMIN D3 PO) Take by mouth.    ? ketoconazole (NIZORAL) 2 % shampoo Apply 1 application topically 2 (two) times a week. 120 mL 0  ? ?No current facility-administered medications for this visit.  ? ? ?Patient confirms/reports the following allergies:  ?No Known Allergies ? ?No orders of the defined types were placed in this encounter. ? ? ?AUTHORIZATION INFORMATION ?Primary Insurance: ?1D#: ?Group #: ? ?Secondary Insurance: ?1D#: ?Group #: ? ?SCHEDULE INFORMATION: ?Date: 11/10/2021 ?Time: ?Location: [place] ? ?

## 2021-10-25 ENCOUNTER — Ambulatory Visit: Payer: Medicaid Other

## 2021-10-30 ENCOUNTER — Telehealth: Payer: Self-pay | Admitting: Gastroenterology

## 2021-10-30 ENCOUNTER — Telehealth: Payer: Self-pay

## 2021-10-30 NOTE — Telephone Encounter (Signed)
Pt left message to cancel procedure on [phone] ?

## 2021-10-30 NOTE — Telephone Encounter (Signed)
Attempted to contact patient to reschedule her colonoscopy 11/10/21 with Dr. Allen Norris at [facility].  I was unable to lvm due to voicemail not set up.  Sent her a mychart message to let her know I attempted to contact her, and inquire on when she would like to reschedule. ? ?Thanks, ? ?Sharyn Lull, CMA ?

## 2021-11-01 ENCOUNTER — Ambulatory Visit
Admission: RE | Admit: 2021-11-01 | Discharge: 2021-11-01 | Disposition: A | Discharge status: Home / Self Care | Payer: Medicaid Other | Source: Ambulatory Visit

## 2021-11-01 DIAGNOSIS — Z1231 Encounter for screening mammogram for malignant neoplasm of breast: Secondary | ICD-10-CM | POA: Diagnosis not present

## 2021-11-10 ENCOUNTER — Ambulatory Visit: Admit: 2021-11-10 | Payer: Medicaid Other | Admitting: Gastroenterology

## 2021-11-10 SURGERY — COLONOSCOPY WITH PROPOFOL
Anesthesia: Choice

## 2022-05-19 IMAGING — MG MM DIGITAL SCREENING BILAT W/ TOMO AND CAD
8 series · 8 of 24 positions shown · non-contrast
Comparison: Previous exam(s).

CLINICAL DATA: Screening.

EXAM:
DIGITAL SCREENING BILATERAL MAMMOGRAM WITH TOMOSYNTHESIS AND CAD
TECHNIQUE: Bilateral screening digital craniocaudal and mediolateral oblique
mammograms were obtained. Bilateral screening digital breast
tomosynthesis was performed. The images were evaluated with
computer-aided detection.

[L CC synth-2D]
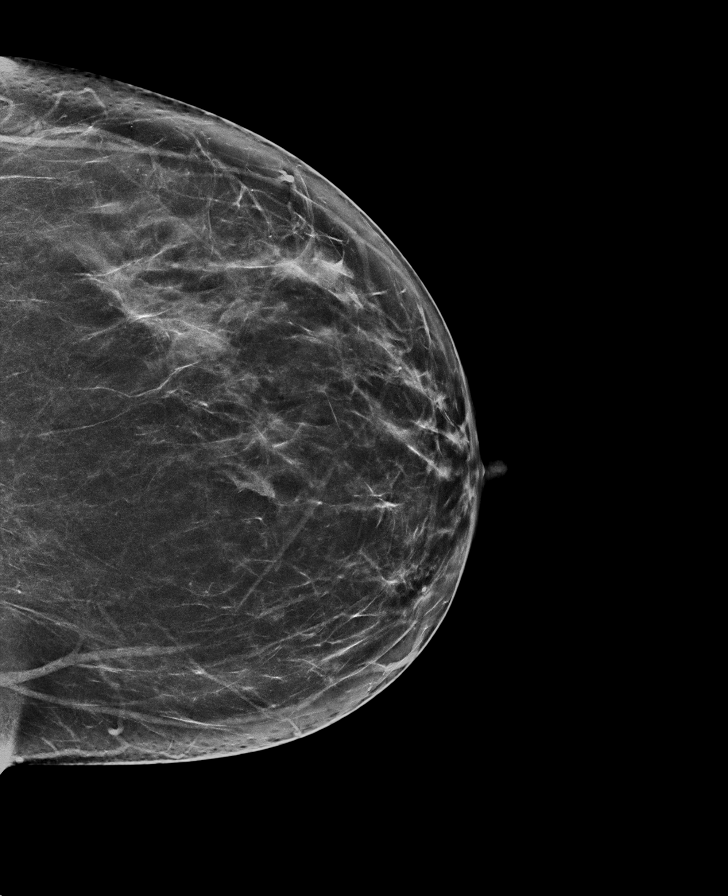

[R CC synth-2D]
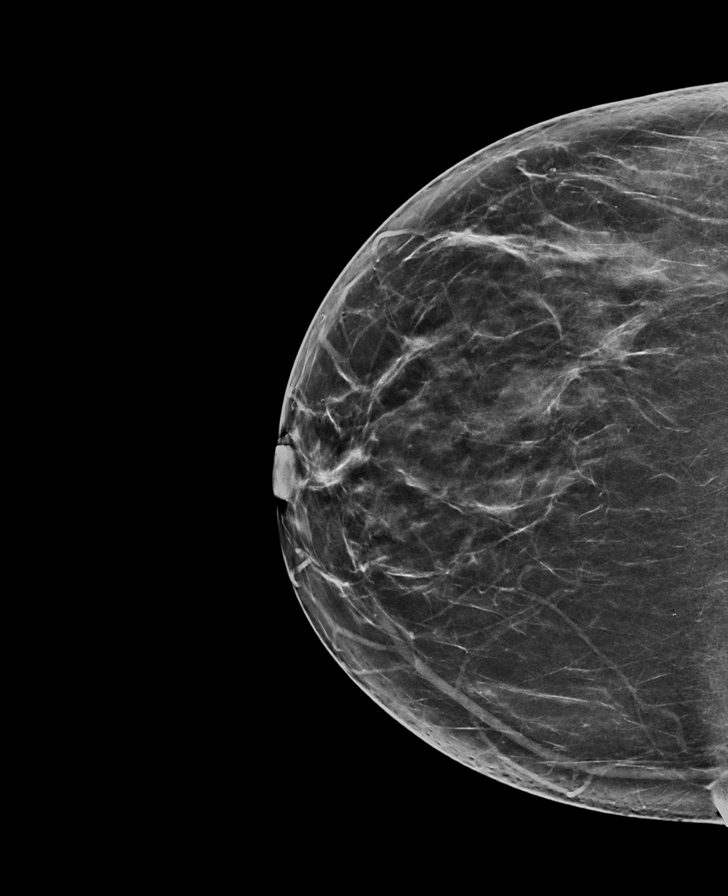

[R MLO synth-2D]
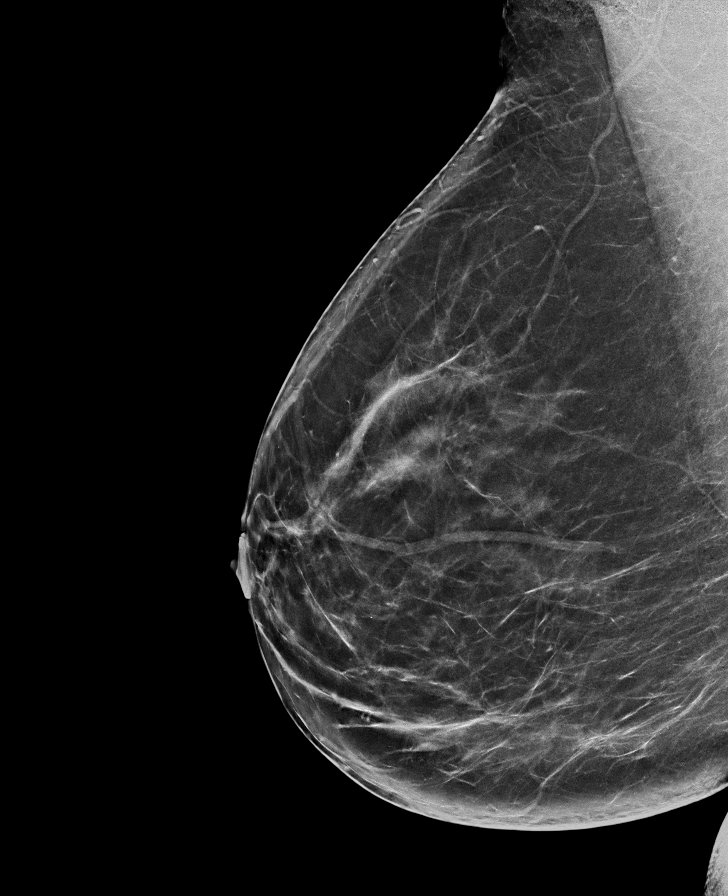

[L MLO synth-2D]
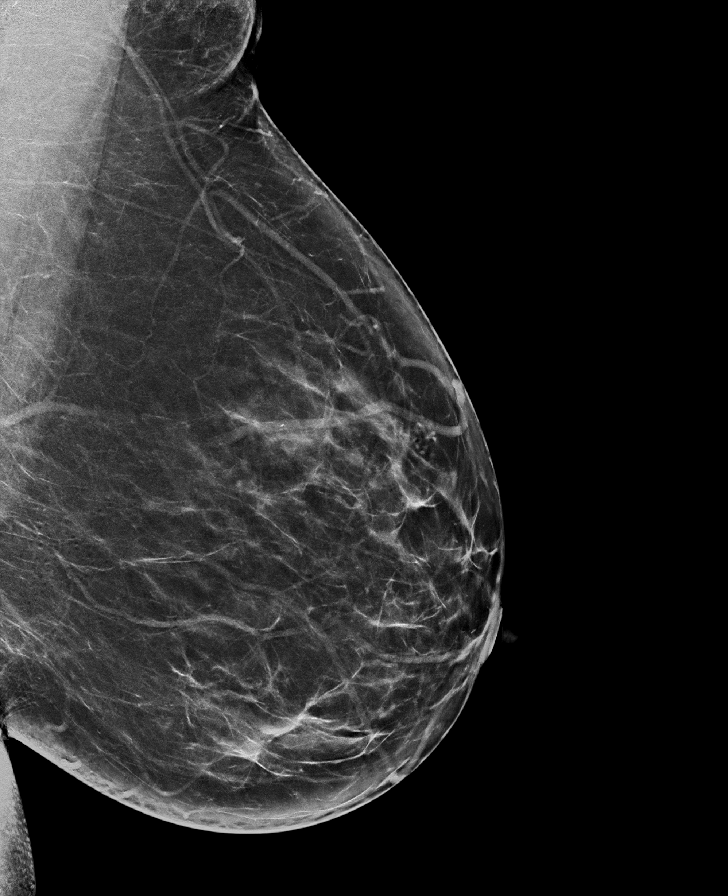

[L CC tomo · tomo slice 39/77.0]
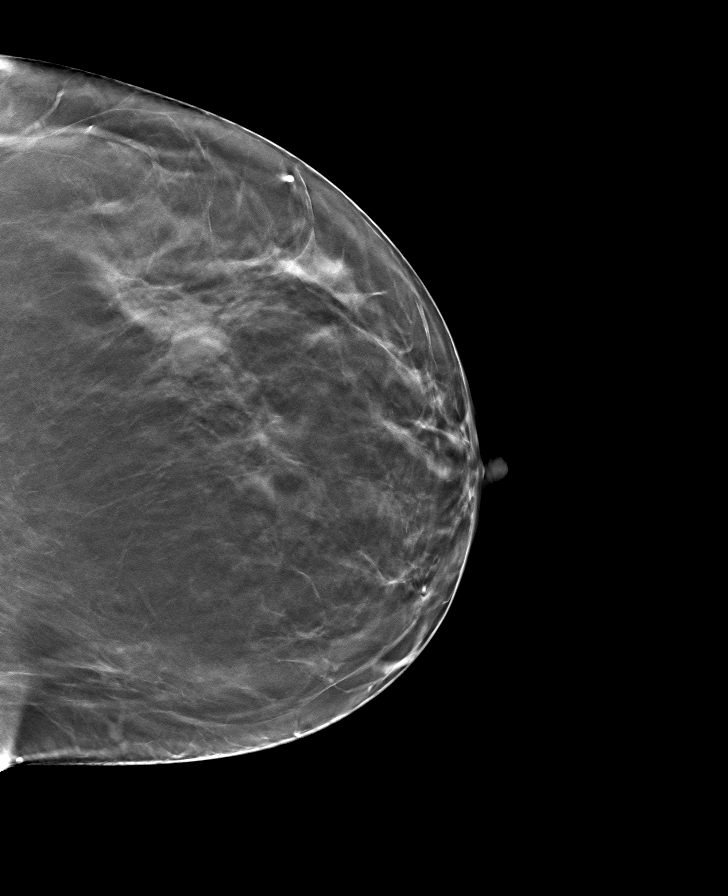

[R MLO tomo · tomo slice 43/84.0]
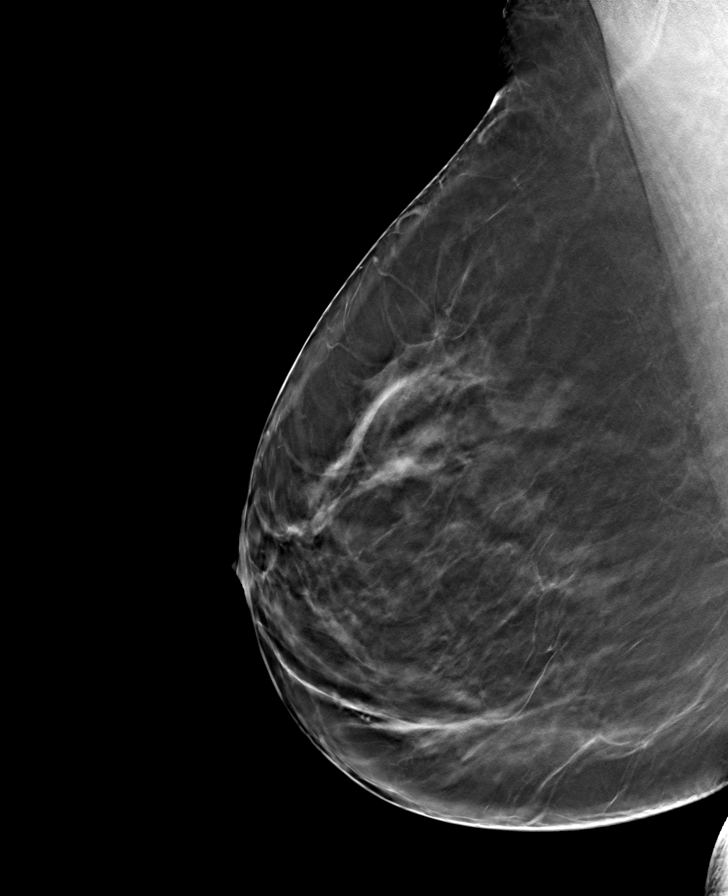

[R CC tomo · tomo slice 37/74.0]
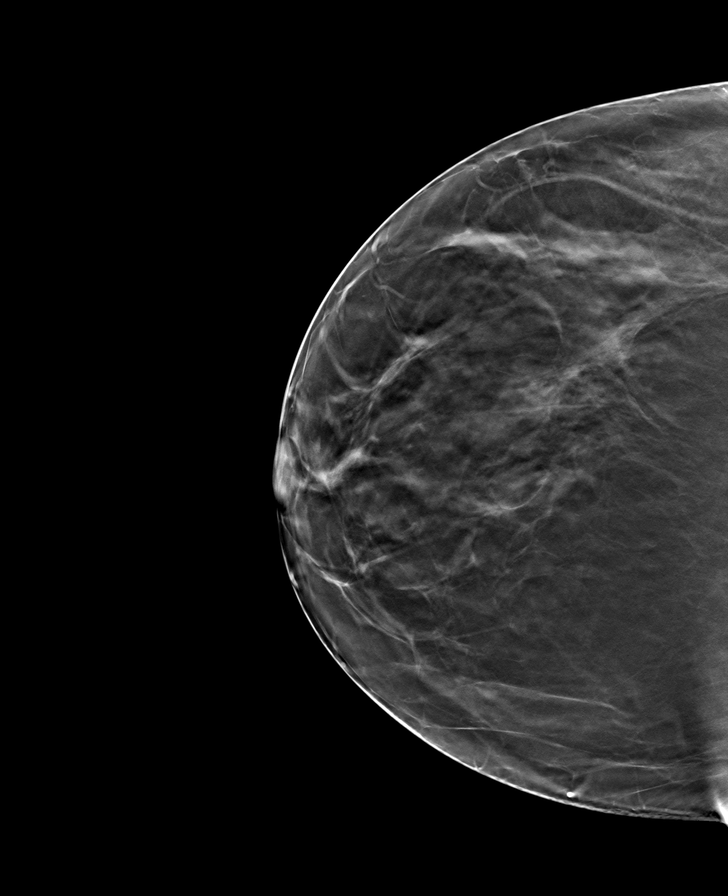

[L MLO tomo · tomo slice 43/84.0]
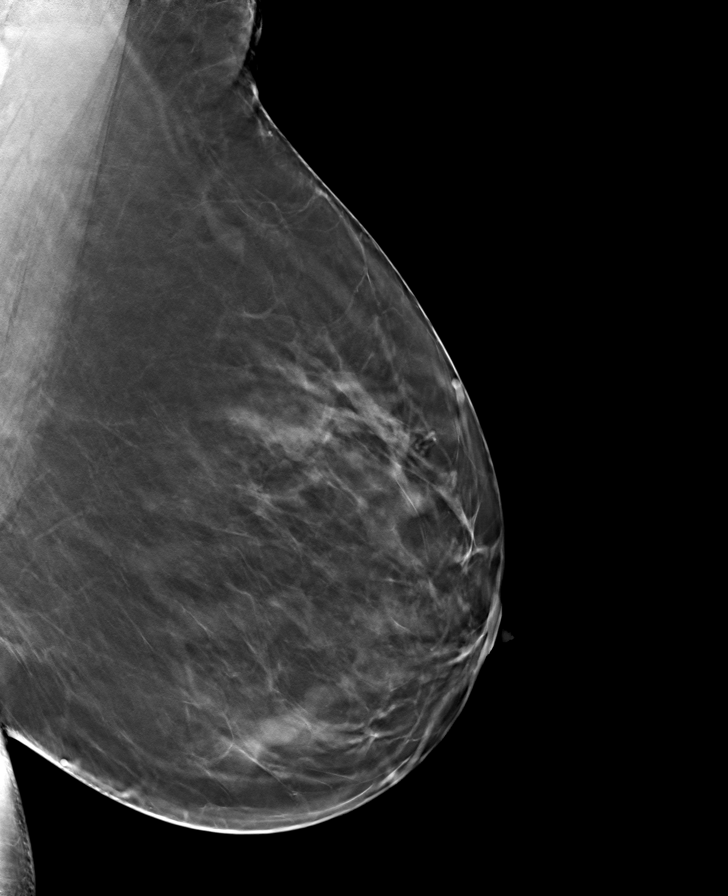

[8 of 24 positions shown; findings below may reference images not displayed]

ACR Breast Density Category b: There are scattered areas of
fibroglandular density.
FINDINGS: There are no findings suspicious for malignancy.
IMPRESSION: No mammographic evidence of malignancy. A result letter of this
screening mammogram will be mailed directly to the patient.

RECOMMENDATION:
Screening mammogram in one year. (Code:51-O-LD2)

BI-RADS CATEGORY  1: Negative.

## 2022-05-21 ENCOUNTER — Ambulatory Visit
Admission: EM | Admit: 2022-05-21 | Discharge: 2022-05-21 | Disposition: A | Discharge status: Home / Self Care | Payer: Medicaid Other | Attending: Internal Medicine | Admitting: Internal Medicine

## 2022-05-21 DIAGNOSIS — B9689 Other specified bacterial agents as the cause of diseases classified elsewhere: Secondary | ICD-10-CM | POA: Insufficient documentation

## 2022-05-21 DIAGNOSIS — R030 Elevated blood-pressure reading, without diagnosis of hypertension: Secondary | ICD-10-CM | POA: Insufficient documentation

## 2022-05-21 DIAGNOSIS — Z113 Encounter for screening for infections with a predominantly sexual mode of transmission: Secondary | ICD-10-CM | POA: Diagnosis not present

## 2022-05-21 DIAGNOSIS — N926 Irregular menstruation, unspecified: Secondary | ICD-10-CM | POA: Diagnosis not present

## 2022-05-21 DIAGNOSIS — N760 Acute vaginitis: Secondary | ICD-10-CM | POA: Insufficient documentation

## 2022-05-21 LAB — WET PREP, GENITAL
Sperm: NONE SEEN
Trich, Wet Prep: NONE SEEN
WBC, Wet Prep HPF POC: <10 — AB (ref <10)
Yeast Wet Prep HPF POC: NONE SEEN

## 2022-05-21 LAB — URINALYSIS, ROUTINE W REFLEX MICROSCOPIC
Bilirubin Urine: NEGATIVE
Glucose, UA: NEGATIVE mg/dL
Leukocytes,Ua: NEGATIVE
Nitrite: NEGATIVE
Protein, ur: NEGATIVE mg/dL
Specific Gravity, Urine: 1.025 (ref 1.005–1.030)
pH: 6.5 (ref 5.0–8.0)

## 2022-05-21 LAB — PREGNANCY, URINE: Preg Test, Ur: NEGATIVE

## 2022-05-21 LAB — URINALYSIS, MICROSCOPIC (REFLEX)

## 2022-05-21 MED ORDER — METRONIDAZOLE 1 % EX GEL: CUTANEOUS | 0 refills | Status: DC

## 2022-05-21 NOTE — Discharge Instructions (Addendum)
Make sure to monitor your blood pressure and if you have 2 more reading of more than 140/90, you need to go back on blood pressure elevation

## 2022-05-21 NOTE — ED Triage Notes (Signed)
Pt c/o abnormal menstraul cycle.  Pt states that her menstrual has been strange for a year.   Pt last menstrual was on 10/23-10/29/23.   Pt states that she had sex on 05/18/22 and had her menstrual has came back on 05/19/22  Pt denies any pain or odor but states that she has had some thick and white discharge after her period stopped.   Pt states that she feels a "flutter" when she urinates x68month. Pt states that "it feels like a little poot" but coming from the Urethra

## 2022-05-21 NOTE — ED Provider Notes (Signed)
[facility]    CSN: [id number] Arrival date & time: 05/21/22  1249      History   Chief Complaint Chief Complaint  Patient presents with   Urinary Tract Infection   Vaginal Discharge    HPI Veronica Berg is a 46 y.o. female presents with history of abnormal periods for the past year. Had a period in July, none in September, and 2 within 2 weeks at the end of October, beginning of November. She is sexually active and does not use contraception or have her partner use condoms. She also has noticed abnormal discharge after her period ended this weekend. Has hx of pituitary adenoma which is being monitored via MRI. She also has strange sensation in her urethra when she void x 2 months.     Past Medical History:  Diagnosis Date   Hypertension     There are no problems to display for this patient.   Past Surgical History:  Procedure Laterality Date   NO PAST SURGERIES      OB History   No obstetric history on file.      Home Medications    Prior to Admission medications   Medication Sig Start Date End Date Taking? Authorizing Provider  b complex vitamins tablet Take 1 tablet by mouth daily.   Yes [provider]  Cholecalciferol (VITAMIN D3 PO) Take by mouth.   Yes [provider]  ketoconazole (NIZORAL) 2 % shampoo Apply 1 application topically 2 (two) times a week. 09/14/21  Yes Juline Patch, MD  metroNIDAZOLE (METROGEL) 1 % gel One applicator qhs x 5 nights 05/21/22  Yes Rodriguez-Southworth, Sunday Spillers, PA-C  hydrochlorothiazide (MICROZIDE) 12.5 MG capsule Take 1 capsule (12.5 mg total) by mouth daily. 07/19/15 04/13/19  Marylene Land, NP    Family History Family History  Problem Relation Age of Onset   Hypertension Mother    Cancer Father    Breast cancer Neg Hx     Social History Social History   Tobacco Use   Smoking status: Former   Smokeless tobacco: Never  Scientific laboratory technician Use: Never used  Substance Use Topics    Alcohol use: Yes    Comment: occasionally   Drug use: No     Allergies   Patient has no known allergies.   Review of Systems Review of Systems  HENT:  Positive for tinnitus.   Eyes:  Negative for visual disturbance.  Respiratory:  Negative for chest tightness and shortness of breath.   Cardiovascular:  Negative for chest pain.       Used to have HTN til 3 years ago and meds were d/c after changing diet, d/c smoking and losing wt. Has not been monitoring it. Has not seen PCP since early this year.   Gastrointestinal:  Negative for abdominal pain.  Genitourinary:  Positive for menstrual problem, vaginal bleeding and vaginal discharge. Negative for pelvic pain.  Neurological:  Negative for headaches.  Psychiatric/Behavioral:         Is under a lot of stress     Physical Exam Triage Vital Signs ED Triage Vitals  Enc Vitals Group     BP 05/21/22 1352 (!) 184/100     Pulse Rate 05/21/22 1352 68     Resp 05/21/22 1352 18     Temp 05/21/22 1352 98.1 F (36.7 C)     Temp Source 05/21/22 1352 Oral     SpO2 05/21/22 1352 100 %     Weight  05/21/22 1349 182 lb (82.6 kg)     Height 05/21/22 1349 '5\' 7"'$  (1.702 m)     Head Circumference --      Peak Flow --      Pain Score 05/21/22 1348 0     Pain Loc --      Pain Edu? --      Excl. in [place]? --    No data found.  Updated Vital Signs BP (!) 184/100 (BP Location: Left Arm)   Pulse 68   Temp 98.1 F (36.7 C) (Oral)   Resp 18   Ht '5\' 7"'$  (1.702 m)   Wt 182 lb (82.6 kg)   LMP 05/13/2022   SpO2 100%   BMI 28.51 kg/m  Repeated BP 170/100  Visual Acuity Right Eye Distance:   Left Eye Distance:   Bilateral Distance:    Right Eye Near:   Left Eye Near:    Bilateral Near:     Physical Exam Vitals and nursing note reviewed.  Constitutional:      General: She is not in acute distress.    Appearance: She is not toxic-appearing.  HENT:     Right Ear: External ear normal.     Left Ear: External ear normal.  Eyes:      General: No scleral icterus.    Conjunctiva/sclera: Conjunctivae normal.  Cardiovascular:     Rate and Rhythm: Normal rate and regular rhythm.     Heart sounds: No murmur heard. Pulmonary:     Effort: Pulmonary effort is normal.  Abdominal:     General: Bowel sounds are normal.     Palpations: Abdomen is soft. There is no mass.     Tenderness: There is no abdominal tenderness.  Musculoskeletal:        General: Normal range of motion.     Cervical back: Neck supple.     Right lower leg: No edema.     Left lower leg: No edema.  Skin:    General: Skin is warm and dry.  Neurological:     Mental Status: She is alert and oriented to person, place, and time.     Gait: Gait normal.  Psychiatric:        Mood and Affect: Mood normal.        Behavior: Behavior normal.        Thought Content: Thought content normal.        Judgment: Judgment normal.      UC Treatments / Results  Labs (all labs ordered are listed, but only abnormal results are displayed) Labs Reviewed  WET PREP, GENITAL - Abnormal; Notable for the following components:      Result Value   Clue Cells Wet Prep HPF POC PRESENT (*)    WBC, Wet Prep HPF POC <10 (*)    All other components within normal limits  URINALYSIS, ROUTINE W REFLEX MICROSCOPIC - Abnormal; Notable for the following components:   Hgb urine dipstick MODERATE (*)    Ketones, ur TRACE (*)    All other components within normal limits  URINALYSIS, MICROSCOPIC (REFLEX) - Abnormal; Notable for the following components:   Bacteria, UA FEW (*)    All other components within normal limits  PREGNANCY, URINE  CERVICOVAGINAL ANCILLARY ONLY    EKG   Radiology No results found.  Procedures Procedures (including critical care time)  Medications Ordered in UC Medications - No data to display  Initial Impression / Assessment and Plan / UC Course  I have  reviewed the triage vital signs and the nursing notes.  Pertinent labs  results that were  available during my care of the patient were reviewed by me and considered in my medical decision making (see chart for details).  BV Abnormal periods STD screen Elevated blood pressure  I placed her on  Metrogel as noted We will inform her if the STD test are positive.  Needs to Fu with her GYN about abnormal periods, monitor her BP and see her PCP to get back on BP meds if still elevated.    Final Clinical Impressions(s) / UC Diagnoses   Final diagnoses:  Bacterial vaginosis  Elevated blood pressure reading     Discharge Instructions      Make sure to monitor your blood pressure and if you have 2 more reading of more than 140/90, you need to go back on blood pressure elevation     ED Prescriptions     Medication Sig Dispense Auth. Provider   metroNIDAZOLE (METROGEL) 1 % gel One applicator qhs x 5 nights 60 g Rodriguez-Southworth, Sunday Spillers, PA-C      PDMP not reviewed this encounter.   Shelby Mattocks, PA-C 05/21/22 1455

## 2022-05-23 LAB — CERVICOVAGINAL ANCILLARY ONLY
Chlamydia: NEGATIVE
Comment: NEGATIVE
Comment: NORMAL
Neisseria Gonorrhea: NEGATIVE

## 2022-06-01 ENCOUNTER — Encounter: Payer: Self-pay | Admitting: Family Medicine

## 2022-06-01 ENCOUNTER — Ambulatory Visit: Payer: Medicaid Other | Admitting: Family Medicine

## 2022-06-01 VITALS — BP 168/98 | HR 80 | Ht 67.0 in | Wt 177.0 lb

## 2022-06-01 DIAGNOSIS — E785 Hyperlipidemia, unspecified: Secondary | ICD-10-CM | POA: Diagnosis not present

## 2022-06-01 DIAGNOSIS — I10 Essential (primary) hypertension: Secondary | ICD-10-CM | POA: Diagnosis not present

## 2022-06-01 MED ORDER — HYDROCHLOROTHIAZIDE 12.5 MG PO TABS
12.5000 mg | ORAL_TABLET | Freq: Every day | ORAL | 3 refills | Status: DC
Start: 2022-06-01 — End: 2022-06-01

## 2022-06-01 MED ORDER — HYDROCHLOROTHIAZIDE 12.5 MG PO TABS: 12.5000 mg | ORAL_TABLET | Freq: Every day | ORAL | 0 refills | Status: DC

## 2022-06-01 NOTE — Progress Notes (Signed)
Date:  06/01/2022   Name:  Veronica Berg   DOB:  1976/07/01   MRN:  [id number]   Chief Complaint: Hypertension (Went off back in 2020- trying to get down by herself with diet. 180/90 is avg)  Hypertension This is a chronic problem. The problem has been gradually worsening since onset. The problem is uncontrolled. Pertinent negatives include no chest pain, headaches, orthopnea, palpitations, PND or shortness of breath. There are no associated agents to hypertension. Risk factors for coronary artery disease include dyslipidemia. Past treatments include diuretics. The current treatment provides mild improvement. There are no compliance problems.  There is no history of angina, CAD/MI, CVA, left ventricular hypertrophy, PVD or retinopathy. There is no history of chronic renal disease, a hypertension causing med or renovascular disease.    Lab Results  Component Value Date   NA 138 09/14/2021   K 4.1 09/14/2021   CO2 22 09/14/2021   GLUCOSE 92 09/14/2021   BUN 13 09/14/2021   CREATININE 0.79 09/14/2021   CALCIUM 8.9 09/14/2021   EGFR 93 09/14/2021   GFRNONAA 110 05/19/2019   Lab Results  Component Value Date   CHOL 171 09/14/2021   HDL 43 09/14/2021   LDLCALC 119 (H) 09/14/2021   TRIG 45 09/14/2021   Lab Results  Component Value Date   TSH 1.320 05/19/2019   Lab Results  Component Value Date   HGBA1C 5.4 09/14/2021   Lab Results  Component Value Date   WBC 7.3 09/14/2021   HGB 11.5 09/14/2021   HCT 35.0 09/14/2021   MCV 90 09/14/2021   PLT 276 09/14/2021   No results found for: "ALT", "AST", "GGT", "ALKPHOS", "BILITOT" No results found for: "25OHVITD2", "25OHVITD3", "VD25OH"   Review of Systems  Respiratory:  Negative for chest tightness, shortness of breath and wheezing.   Cardiovascular:  Negative for chest pain, palpitations, orthopnea and PND.  Neurological:  Negative for headaches.    There are no problems to display for this patient.   No Known  Allergies  Past Surgical History:  Procedure Laterality Date   NO PAST SURGERIES      Social History   Tobacco Use   Smoking status: Former   Smokeless tobacco: Never  Vaping Use   Vaping Use: Never used  Substance Use Topics   Alcohol use: Yes    Comment: occasionally   Drug use: No     Medication list has been reviewed and updated.  Current Meds  Medication Sig   b complex vitamins tablet Take 1 tablet by mouth daily.   hydrochlorothiazide (HYDRODIURIL) 12.5 MG tablet Take 1 tablet (12.5 mg total) by mouth daily.   ketoconazole (NIZORAL) 2 % shampoo Apply 1 application topically 2 (two) times a week.   metroNIDAZOLE (METROGEL) 1 % gel One applicator qhs x 5 nights   [DISCONTINUED] Cholecalciferol (VITAMIN D3 PO) Take by mouth.       06/01/2022    2:02 PM 09/14/2021    3:57 PM 05/18/2019    2:10 PM  GAD 7 : Generalized Anxiety Score  Nervous, Anxious, on Edge 0 0 0  Control/stop worrying 1 0 0  Worry too much - different things 1 0 0  Trouble relaxing 0 0 0  Restless 0 0 0  Easily annoyed or irritable 0 0 0  Afraid - awful might happen 0 0 0  Total GAD 7 Score 2 0 0  Anxiety Difficulty Somewhat difficult Not difficult at all  06/01/2022    2:01 PM 09/14/2021    3:57 PM 05/18/2019    2:09 PM  Depression screen PHQ 2/9  Decreased Interest 0 0 0  Down, Depressed, Hopeless 0 0 0  PHQ - 2 Score 0 0 0  Altered sleeping 0 0 0  Tired, decreased energy 0 0 0  Change in appetite 0 0 0  Feeling bad or failure about yourself  0 0 0  Trouble concentrating 0 0 0  Moving slowly or fidgety/restless 0 0 0  Suicidal thoughts 0 0 0  PHQ-9 Score 0 0 0  Difficult doing work/chores Not difficult at all Not difficult at all     BP Readings from Last 3 Encounters:  06/01/22 (!) 168/98  05/21/22 (!) 184/100  09/14/21 122/82    Physical Exam HENT:     Right Ear: Tympanic membrane and ear canal normal.     Left Ear: Tympanic membrane and ear canal normal.      Nose: Nose normal. No congestion or rhinorrhea.     Mouth/Throat:     Pharynx: No oropharyngeal exudate or posterior oropharyngeal erythema.  Eyes:     Pupils: Pupils are equal, round, and reactive to light.  Neck:     Vascular: No carotid bruit.  Cardiovascular:     Rate and Rhythm: Normal rate.     Heart sounds: No murmur heard.    No friction rub. No gallop.  Pulmonary:     Breath sounds: No wheezing, rhonchi or rales.  Abdominal:     Tenderness: There is no abdominal tenderness.  Musculoskeletal:     Cervical back: No tenderness.  Lymphadenopathy:     Cervical: No cervical adenopathy.  Skin:    General: Skin is warm.     Wt Readings from Last 3 Encounters:  06/01/22 177 lb (80.3 kg)  05/21/22 182 lb (82.6 kg)  09/14/21 184 lb (83.5 kg)    BP (!) 168/98 (BP Location: Right Arm, Cuff Size: Large)   Pulse 80   Ht _0  (1.702 m)   Wt 177 lb (80.3 kg)   LMP 05/13/2022 (Approximate)   SpO2 97%   BMI 27.72 kg/m   Assessment and Plan:  1. Primary hypertension Chronic.  Uncontrolled.  Stable.  Blood pressure today is 168/98.  Asymptomatic.  Patient has stopped medication in the past wanting to do it on her own with weight loss stopping smoking and dietary discretion however patient now realizes she needs to be on medication we will resume her hydrochlorothiazide 12.5 mg once a day.  And will recheck blood pressure in 4 weeks. - hydrochlorothiazide (HYDRODIURIL) 12.5 MG tablet; Take 1 tablet (12.5 mg total) by mouth daily.  Dispense: 60 tablet; Refill: 3  2. Dyslipidemia Chronic.  Uncontrolled.  Stable.  Patient with history of hyperlipidemia and patient has been given low-cholesterol low triglyceride dietary guidelines and will recheck fasting upon return for blood pressure check.  Otilio Miu, MD

## 2022-06-01 NOTE — Patient Instructions (Signed)

## 2022-06-29 ENCOUNTER — Ambulatory Visit: Payer: Medicaid Other | Admitting: Family Medicine

## 2022-06-29 ENCOUNTER — Encounter: Payer: Self-pay | Admitting: Family Medicine

## 2022-06-29 VITALS — BP 152/86 | HR 64 | Ht 67.0 in | Wt 178.0 lb

## 2022-06-29 DIAGNOSIS — I10 Essential (primary) hypertension: Secondary | ICD-10-CM

## 2022-06-29 MED ORDER — AMLODIPINE BESYLATE 2.5 MG PO TABS: 2.5000 mg | ORAL_TABLET | Freq: Every day | ORAL | 0 refills | Status: DC

## 2022-06-29 MED ORDER — HYDROCHLOROTHIAZIDE 12.5 MG PO TABS: 12.5000 mg | ORAL_TABLET | Freq: Every day | ORAL | 1 refills | Status: DC

## 2022-06-29 NOTE — Progress Notes (Signed)
Date:  06/29/2022   Name:  Veronica Berg   DOB:  05-28-1976   MRN:  [id number]   Chief Complaint: Hypertension  HPI  Lab Results  Component Value Date   NA 138 09/14/2021   K 4.1 09/14/2021   CO2 22 09/14/2021   GLUCOSE 92 09/14/2021   BUN 13 09/14/2021   CREATININE 0.79 09/14/2021   CALCIUM 8.9 09/14/2021   EGFR 93 09/14/2021   GFRNONAA 110 05/19/2019   Lab Results  Component Value Date   CHOL 171 09/14/2021   HDL 43 09/14/2021   LDLCALC 119 (H) 09/14/2021   TRIG 45 09/14/2021   Lab Results  Component Value Date   TSH 1.320 05/19/2019   Lab Results  Component Value Date   HGBA1C 5.4 09/14/2021   Lab Results  Component Value Date   WBC 7.3 09/14/2021   HGB 11.5 09/14/2021   HCT 35.0 09/14/2021   MCV 90 09/14/2021   PLT 276 09/14/2021   No results found for: "ALT", "AST", "GGT", "ALKPHOS", "BILITOT" No results found for: "25OHVITD2", "25OHVITD3", "VD25OH"   Review of Systems  There are no problems to display for this patient.   No Known Allergies  Past Surgical History:  Procedure Laterality Date   NO PAST SURGERIES      Social History   Tobacco Use   Smoking status: Former   Smokeless tobacco: Never  Vaping Use   Vaping Use: Never used  Substance Use Topics   Alcohol use: Yes    Comment: occasionally   Drug use: No     Medication list has been reviewed and updated.  Current Meds  Medication Sig   b complex vitamins tablet Take 1 tablet by mouth daily.   hydrochlorothiazide (HYDRODIURIL) 12.5 MG tablet Take 1 tablet (12.5 mg total) by mouth daily.   ketoconazole (NIZORAL) 2 % shampoo Apply 1 application topically 2 (two) times a week.   metroNIDAZOLE (METROGEL) 1 % gel One applicator qhs x 5 nights       06/01/2022    2:02 PM 09/14/2021    3:57 PM 05/18/2019    2:10 PM  GAD 7 : Generalized Anxiety Score  Nervous, Anxious, on Edge 0 0 0  Control/stop worrying 1 0 0  Worry too much - different things 1 0 0  Trouble relaxing  0 0 0  Restless 0 0 0  Easily annoyed or irritable 0 0 0  Afraid - awful might happen 0 0 0  Total GAD 7 Score 2 0 0  Anxiety Difficulty Somewhat difficult Not difficult at all        06/01/2022    2:01 PM 09/14/2021    3:57 PM 05/18/2019    2:09 PM  Depression screen PHQ 2/9  Decreased Interest 0 0 0  Down, Depressed, Hopeless 0 0 0  PHQ - 2 Score 0 0 0  Altered sleeping 0 0 0  Tired, decreased energy 0 0 0  Change in appetite 0 0 0  Feeling bad or failure about yourself  0 0 0  Trouble concentrating 0 0 0  Moving slowly or fidgety/restless 0 0 0  Suicidal thoughts 0 0 0  PHQ-9 Score 0 0 0  Difficult doing work/chores Not difficult at all Not difficult at all     BP Readings from Last 3 Encounters:  06/29/22 (!) 152/86  06/01/22 (!) 168/98  05/21/22 (!) 184/100    Physical Exam  Wt Readings from Last 3 Encounters:  06/29/22  178 lb (80.7 kg)  06/01/22 177 lb (80.3 kg)  05/21/22 182 lb (82.6 kg)    BP (!) 152/86 (BP Location: Right Arm, Patient Position: Sitting, Cuff Size: Normal)   Pulse 64   Ht _0  (1.702 m)   Wt 178 lb (80.7 kg)   LMP 05/13/2022 (Approximate)   BMI 27.88 kg/m   Assessment and Plan:  1. Primary hypertension chronic Chronic.  Uncontrolled.  Improved.  Systolic has come from [phone] with diastolic in acceptable range.  We will continue hydrochlorothiazide 12.5 mg once a day and add amlodipine 2.5 mg once a day taken at the same time and will recheck in 4 weeks. - hydrochlorothiazide (HYDRODIURIL) 12.5 MG tablet; Take 1 tablet (12.5 mg total) by mouth daily.  Dispense: 90 tablet; Refill: 1 - amLODipine (NORVASC) 2.5 MG tablet; Take 1 tablet (2.5 mg total) by mouth daily.  Dispense: 90 tablet; Refill: 0    Otilio Miu, MD

## 2022-07-02 ENCOUNTER — Ambulatory Visit
Admission: EM | Admit: 2022-07-02 | Discharge: 2022-07-02 | Disposition: A | Discharge status: Home / Self Care | Payer: Medicaid Other | Attending: Family | Admitting: Family

## 2022-07-02 DIAGNOSIS — J101 Influenza due to other identified influenza virus with other respiratory manifestations: Secondary | ICD-10-CM | POA: Diagnosis not present

## 2022-07-02 DIAGNOSIS — J029 Acute pharyngitis, unspecified: Secondary | ICD-10-CM | POA: Insufficient documentation

## 2022-07-02 DIAGNOSIS — Z1152 Encounter for screening for COVID-19: Secondary | ICD-10-CM | POA: Diagnosis not present

## 2022-07-02 LAB — RESP PANEL BY RT-PCR (FLU A&B, COVID) ARPGX2
Influenza A by PCR: NEGATIVE
Influenza B by PCR: POSITIVE — AB
SARS Coronavirus 2 by RT PCR: NEGATIVE

## 2022-07-02 LAB — GROUP A STREP BY PCR: Group A Strep by PCR: NOT DETECTED

## 2022-07-02 MED ORDER — OSELTAMIVIR PHOSPHATE 75 MG PO CAPS: 75.0000 mg | ORAL_CAPSULE | Freq: Two times a day (BID) | ORAL | 0 refills | Status: AC

## 2022-07-02 NOTE — ED Triage Notes (Signed)
Pt declined the flu test as it is a nasal swab. Pt believes she only has strep.

## 2022-07-02 NOTE — Discharge Instructions (Signed)
Recommend start Tamiflu '75mg'$  twice a day for 5 days. Continue to push fluids. Continue OTC Tylenol '1000mg'$  or Ibuprofen '600mg'$  every 6 to 8 hours as needed for throat pain or fever. Rest. Follow-up in 2 to 3 days if not improving.

## 2022-07-02 NOTE — ED Provider Notes (Signed)
[facility]    CSN: [id number] Arrival date & time: 07/02/22  3664      History   Chief Complaint Chief Complaint  Patient presents with   Cough    HPI Veronica Berg is a 46 y.o. female.   46 year old female presents with headache, sore throat, runny nose, and cough for the past 3 to 4 days. Feels very fatigued and decreased appetite. No vomiting or diarrhea. Possible fever. Took home COVID test which was negative. Son recently sick last week with some similar symptoms. Has taken Ibuprofen, Tylenol and Mucinex with some relief. Former smoker. Other chronic health issues include HTN. Currently on HCTZ. Was prescribed Norvasc 3 days ago but has not started it yet.   The history is provided by the patient.    Past Medical History:  Diagnosis Date   Hypertension     There are no problems to display for this patient.   Past Surgical History:  Procedure Laterality Date   NO PAST SURGERIES      OB History   No obstetric history on file.      Home Medications    Prior to Admission medications   Medication Sig Start Date End Date Taking? Authorizing Provider  amLODipine (NORVASC) 2.5 MG tablet Take 1 tablet (2.5 mg total) by mouth daily. 06/29/22  Yes Juline Patch, MD  b complex vitamins tablet Take 1 tablet by mouth daily.   Yes [provider]  hydrochlorothiazide (HYDRODIURIL) 12.5 MG tablet Take 1 tablet (12.5 mg total) by mouth daily. 06/29/22  Yes Juline Patch, MD  oseltamivir (TAMIFLU) 75 MG capsule Take 1 capsule (75 mg total) by mouth every 12 (twelve) hours for 5 days. 07/02/22 07/07/22 Yes Gola Bribiesca, Nicholes Stairs, NP    Family History Family History  Problem Relation Age of Onset   Hypertension Mother    Cancer Father    Breast cancer Neg Hx     Social History Social History   Tobacco Use   Smoking status: Former   Smokeless tobacco: Never  Scientific laboratory technician Use: Never used  Substance Use Topics   Alcohol use: Yes     Comment: occasionally   Drug use: No     Allergies   Patient has no known allergies.   Review of Systems Review of Systems  Constitutional:  Positive for activity change, appetite change, chills, fatigue and fever.  HENT:  Positive for congestion, postnasal drip, rhinorrhea, sinus pressure, sinus pain and sore throat. Negative for ear discharge, ear pain, facial swelling, mouth sores and trouble swallowing.   Eyes:  Negative for pain, discharge and redness.  Respiratory:  Positive for cough. Negative for chest tightness and shortness of breath.   Gastrointestinal:  Positive for nausea. Negative for diarrhea and vomiting.  Musculoskeletal:  Positive for arthralgias and myalgias. Negative for neck pain and neck stiffness.  Skin:  Negative for color change and rash.  Allergic/Immunologic: Negative for environmental allergies, food allergies and immunocompromised state.  Neurological:  Positive for light-headedness and headaches. Negative for tremors, seizures, syncope, speech difficulty and numbness.  Hematological:  Negative for adenopathy. Does not bruise/bleed easily.     Physical Exam Triage Vital Signs ED Triage Vitals  Enc Vitals Group     BP 07/02/22 0834 (!) 134/94     Pulse Rate 07/02/22 0834 (!) 106     Resp 07/02/22 0834 20     Temp 07/02/22 0834 99.1 F (37.3 C)  Temp Source 07/02/22 0834 Oral     SpO2 07/02/22 0834 100 %     Weight 07/02/22 0832 175 lb (79.4 kg)     Height 07/02/22 0832 '5\' 7"'$  (1.702 m)     Head Circumference --      Peak Flow --      Pain Score 07/02/22 0832 7     Pain Loc --      Pain Edu? --      Excl. in [place]? --    No data found.  Updated Vital Signs BP (!) 134/94 (BP Location: Left Arm)   Pulse (!) 106   Temp 99.1 F (37.3 C) (Oral)   Resp 20   Ht '5\' 7"'$  (1.702 m)   Wt 175 lb (79.4 kg)   LMP 06/02/2022   SpO2 100%   BMI 27.41 kg/m   Visual Acuity Right Eye Distance:   Left Eye Distance:   Bilateral Distance:    Right Eye  Near:   Left Eye Near:    Bilateral Near:     Physical Exam Vitals and nursing note reviewed.  Constitutional:      General: She is awake. She is not in acute distress.    Appearance: She is well-developed and well-groomed. She is ill-appearing.     Comments: She is sitting on the exam table in no acute distress but appears tired and ill.   HENT:     Head: Normocephalic and atraumatic.     Right Ear: Hearing, tympanic membrane, ear canal and external ear normal.     Left Ear: Hearing, tympanic membrane, ear canal and external ear normal.     Nose: Congestion present.     Right Sinus: No maxillary sinus tenderness or frontal sinus tenderness.     Left Sinus: No maxillary sinus tenderness or frontal sinus tenderness.     Mouth/Throat:     Lips: Pink.     Mouth: Mucous membranes are moist.     Pharynx: Uvula midline. Posterior oropharyngeal erythema present. No pharyngeal swelling, oropharyngeal exudate or uvula swelling.  Eyes:     Extraocular Movements: Extraocular movements intact.     Conjunctiva/sclera: Conjunctivae normal.  Cardiovascular:     Rate and Rhythm: Regular rhythm. Tachycardia present.     Heart sounds: Normal heart sounds. No murmur heard. Pulmonary:     Effort: Pulmonary effort is normal. No respiratory distress.     Breath sounds: Normal breath sounds and air entry. No decreased air movement. No decreased breath sounds, wheezing, rhonchi or rales.  Musculoskeletal:     Cervical back: Normal range of motion and neck supple.  Lymphadenopathy:     Cervical: No cervical adenopathy.  Skin:    General: Skin is warm and dry.     Capillary Refill: Capillary refill takes less than 2 seconds.     Findings: No rash.  Neurological:     General: No focal deficit present.     Mental Status: She is alert and oriented to person, place, and time.  Psychiatric:        Mood and Affect: Mood normal.        Behavior: Behavior normal. Behavior is cooperative.        Thought  Content: Thought content normal.        Judgment: Judgment normal.      UC Treatments / Results  Labs (all labs ordered are listed, but only abnormal results are displayed) Labs Reviewed  RESP PANEL BY RT-PCR (FLU A&B, COVID)  ARPGX2 - Abnormal; Notable for the following components:      Result Value   Influenza B by PCR POSITIVE (*)    All other components within normal limits  GROUP A STREP BY PCR    EKG   Radiology No results found.  Procedures Procedures (including critical care time)  Medications Ordered in UC Medications - No data to display  Initial Impression / Assessment and Plan / UC Course  I have reviewed the triage vital signs and the nursing notes.  Pertinent labs & imaging results that were available during my care of the patient were reviewed by me and considered in my medical decision making (see chart for details).     Reviewed negative COVID and strep test with patient. Discussed positive Influenza B test result. Due to HTN and former smoker, recommend start Tamiflu '75mg'$  twice a day for 5 days. Continue to push fluids. May continue OTC Tylenol '1000mg'$  or Ibuprofen '600mg'$  every 8 hours as needed for throat pain. Rest. Note written for work. Follow-up in 2 to 3 days if not improving.    Final Clinical Impressions(s) / UC Diagnoses   Final diagnoses:  Influenza B  Sore throat  Encounter for screening for COVID-19     Discharge Instructions      Recommend start Tamiflu '75mg'$  twice a day for 5 days. Continue to push fluids. Continue OTC Tylenol '1000mg'$  or Ibuprofen '600mg'$  every 6 to 8 hours as needed for throat pain or fever. Rest. Follow-up in 2 to 3 days if not improving.      ED Prescriptions     Medication Sig Dispense Auth. Provider   oseltamivir (TAMIFLU) 75 MG capsule Take 1 capsule (75 mg total) by mouth every 12 (twelve) hours for 5 days. 10 capsule Anndrea Mihelich, Nicholes Stairs, NP      PDMP not reviewed this encounter.   Katy Apo,  NP 07/02/22 [phone]

## 2022-07-02 NOTE — ED Triage Notes (Signed)
Pt c/o headache, sore throat, fatigue, productive cough  Pt has taken a home covid test and it was negative.  Pt asks for a strep and flu test  Pt has been taking ibuprofen and tylenol for pain.  Last dose was last night at 9pm.

## 2022-07-03 ENCOUNTER — Encounter: Payer: Self-pay | Admitting: Family Medicine

## 2022-07-27 ENCOUNTER — Ambulatory Visit: Payer: Medicaid Other | Admitting: Family Medicine

## 2022-07-27 ENCOUNTER — Encounter: Payer: Self-pay | Admitting: Family Medicine

## 2022-07-27 VITALS — BP 134/80 | HR 82 | Ht 67.0 in | Wt 174.0 lb

## 2022-07-27 DIAGNOSIS — I10 Essential (primary) hypertension: Secondary | ICD-10-CM | POA: Diagnosis not present

## 2022-07-27 DIAGNOSIS — R079 Chest pain, unspecified: Secondary | ICD-10-CM

## 2022-07-27 MED ORDER — CARVEDILOL 3.125 MG PO TABS: 3.1250 mg | ORAL_TABLET | Freq: Two times a day (BID) | ORAL | 0 refills | Status: DC

## 2022-07-27 NOTE — Progress Notes (Signed)
Date:  07/27/2022   Name:  Veronica Berg   DOB:  03/11/1976   MRN:  [id number]   Chief Complaint: Hypertension (Added amlodipine, but could not take because gums were swelling)  Hypertension This is a chronic problem. The current episode started in the past 7 days. The problem has been gradually improving since onset. The problem is controlled. Associated symptoms include chest pain and palpitations. Pertinent negatives include no anxiety, blurred vision, headaches, malaise/fatigue, neck pain, orthopnea, peripheral edema, PND, shortness of breath or sweats. There are no associated agents to hypertension. Risk factors for coronary artery disease include diabetes mellitus and dyslipidemia. Past treatments include diuretics. The current treatment provides moderate improvement. There are no compliance problems.  There is no history of angina, kidney disease, CAD/MI, CVA, heart failure, left ventricular hypertrophy, PVD or retinopathy. There is no history of chronic renal disease, a hypertension causing med or renovascular disease.  Chest Pain  The current episode started more than 1 month ago (2 months). The onset quality is gradual. The problem occurs every several days (weekly). The problem has been unchanged. The pain is present in the substernal region (15-20 minutes). The quality of the pain is described as burning. The pain does not radiate. Associated symptoms include exertional chest pressure and palpitations. Pertinent negatives include no cough, diaphoresis, dizziness, fever, headaches, irregular heartbeat, malaise/fatigue, nausea, near-syncope, orthopnea, PND, shortness of breath or syncope.  Her past medical history is significant for hypertension.  Pertinent negatives for past medical history include no PVD.    Lab Results  Component Value Date   NA 138 09/14/2021   K 4.1 09/14/2021   CO2 22 09/14/2021   GLUCOSE 92 09/14/2021   BUN 13 09/14/2021   CREATININE 0.79 09/14/2021    CALCIUM 8.9 09/14/2021   EGFR 93 09/14/2021   GFRNONAA 110 05/19/2019   Lab Results  Component Value Date   CHOL 171 09/14/2021   HDL 43 09/14/2021   LDLCALC 119 (H) 09/14/2021   TRIG 45 09/14/2021   Lab Results  Component Value Date   TSH 1.320 05/19/2019   Lab Results  Component Value Date   HGBA1C 5.4 09/14/2021   Lab Results  Component Value Date   WBC 7.3 09/14/2021   HGB 11.5 09/14/2021   HCT 35.0 09/14/2021   MCV 90 09/14/2021   PLT 276 09/14/2021   No results found for: "ALT", "AST", "GGT", "ALKPHOS", "BILITOT" No results found for: "25OHVITD2", "25OHVITD3", "VD25OH"   Review of Systems  Constitutional:  Negative for chills, diaphoresis, fatigue, fever and malaise/fatigue.  HENT:  Negative for trouble swallowing.   Eyes:  Negative for blurred vision and visual disturbance.  Respiratory:  Negative for cough, choking, shortness of breath and wheezing.   Cardiovascular:  Positive for chest pain and palpitations. Negative for orthopnea, syncope, PND and near-syncope.  Gastrointestinal:  Negative for blood in stool and nausea.  Musculoskeletal:  Negative for neck pain.  Neurological:  Negative for dizziness and headaches.    There are no problems to display for this patient.   Allergies  Allergen Reactions   Amlodipine     Gum swelling     Past Surgical History:  Procedure Laterality Date   NO PAST SURGERIES      Social History   Tobacco Use   Smoking status: Former   Smokeless tobacco: Never  Vaping Use   Vaping Use: Never used  Substance Use Topics   Alcohol use: Yes    Comment: occasionally  Drug use: No     Medication list has been reviewed and updated.  Current Meds  Medication Sig   b complex vitamins tablet Take 1 tablet by mouth daily.   hydrochlorothiazide (HYDRODIURIL) 12.5 MG tablet Take 1 tablet (12.5 mg total) by mouth daily.       07/27/2022    3:37 PM 06/01/2022    2:02 PM 09/14/2021    3:57 PM 05/18/2019    2:10 PM   GAD 7 : Generalized Anxiety Score  Nervous, Anxious, on Edge 0 0 0 0  Control/stop worrying 0 1 0 0  Worry too much - different things 0 1 0 0  Trouble relaxing 0 0 0 0  Restless 0 0 0 0  Easily annoyed or irritable 0 0 0 0  Afraid - awful might happen 0 0 0 0  Total GAD 7 Score 0 2 0 0  Anxiety Difficulty Not difficult at all Somewhat difficult Not difficult at all        07/27/2022    3:37 PM 06/01/2022    2:01 PM 09/14/2021    3:57 PM  Depression screen PHQ 2/9  Decreased Interest 0 0 0  Down, Depressed, Hopeless 0 0 0  PHQ - 2 Score 0 0 0  Altered sleeping 0 0 0  Tired, decreased energy 0 0 0  Change in appetite 0 0 0  Feeling bad or failure about yourself  0 0 0  Trouble concentrating 0 0 0  Moving slowly or fidgety/restless 0 0 0  Suicidal thoughts 0 0 0  PHQ-9 Score 0 0 0  Difficult doing work/chores Not difficult at all Not difficult at all Not difficult at all    BP Readings from Last 3 Encounters:  07/27/22 134/80  07/02/22 (!) 134/94  06/29/22 (!) 152/86    Physical Exam Vitals and nursing note reviewed. Exam conducted with a chaperone present.  Constitutional:      General: She is not in acute distress.    Appearance: She is not diaphoretic.  HENT:     Head: Normocephalic and atraumatic.     Right Ear: Tympanic membrane and external ear normal.     Left Ear: Tympanic membrane and external ear normal.     Nose: Nose normal.     Mouth/Throat:     Mouth: Mucous membranes are moist.  Eyes:     General:        Right eye: No discharge.        Left eye: No discharge.     Conjunctiva/sclera: Conjunctivae normal.     Pupils: Pupils are equal, round, and reactive to light.  Neck:     Thyroid: No thyromegaly.     Vascular: No JVD.  Cardiovascular:     Rate and Rhythm: Normal rate and regular rhythm.     Heart sounds: Normal heart sounds. No murmur heard.    No friction rub. No gallop.  Pulmonary:     Effort: Pulmonary effort is normal.     Breath  sounds: Normal breath sounds. No wheezing, rhonchi or rales.  Abdominal:     General: Bowel sounds are normal.     Palpations: Abdomen is soft. There is no mass.     Tenderness: There is no abdominal tenderness. There is no guarding.  Musculoskeletal:        General: Normal range of motion.     Cervical back: Normal range of motion and neck supple.  Lymphadenopathy:     Cervical:  No cervical adenopathy.  Skin:    General: Skin is warm and dry.  Neurological:     Mental Status: She is alert.     Deep Tendon Reflexes: Reflexes are normal and symmetric.     Wt Readings from Last 3 Encounters:  07/27/22 174 lb (78.9 kg)  07/02/22 175 lb (79.4 kg)  06/29/22 178 lb (80.7 kg)    BP 134/80 (BP Location: Right Arm, Cuff Size: Large)   Pulse 82   Ht '5\' 7"'$  (1.702 m)   Wt 174 lb (78.9 kg)   LMP 07/08/2022 (Exact Date)   SpO2 99%   BMI 27.25 kg/m   Assessment and Plan:  1. Primary hypertension Chronic.  Persistent.  Stable.  Blood pressure 134/80.  Patient had discontinued her amlodipine because of some swelling and a couple areas of the gums which does not sound angioedema in nature but more like secondary to food intake.  I eat popcorn.  Amlodipine was discontinued by the patient and we did not resume with her choice to pursue with Coreg 3.125 twice a day and to continue hydrochlorothiazide 12.5 mg once a day. - carvedilol (COREG) 3.125 MG tablet; Take 1 tablet (3.125 mg total) by mouth 2 (two) times daily with a meal.  Dispense: 60 tablet; Refill: 0  2. Chest pain, unspecified type Patient has had chest tightness substernal and we obtained an EKG with the following results: Sinus rhythm with rate 75.  Intervals normal no LVH noted by criteria no evidence of ST-T wave changes Q waves or delay in R wave progression to indicate ischemic concerns.  Discomfort is of a GI extent and patient has been suggested to obtain over-the-counter reflux medication as a trial. - EKG 12-Lead    Otilio Miu, MD

## 2022-08-17 ENCOUNTER — Ambulatory Visit: Payer: Medicaid Other | Admitting: Family Medicine

## 2022-08-21 ENCOUNTER — Encounter: Payer: Self-pay | Admitting: Family Medicine

## 2022-08-30 ENCOUNTER — Other Ambulatory Visit: Payer: Self-pay

## 2022-08-30 DIAGNOSIS — I10 Essential (primary) hypertension: Secondary | ICD-10-CM

## 2022-08-30 MED ORDER — CARVEDILOL 3.125 MG PO TABS: 3.1250 mg | ORAL_TABLET | Freq: Two times a day (BID) | ORAL | 1 refills | Status: DC

## 2022-09-27 ENCOUNTER — Other Ambulatory Visit: Payer: Self-pay | Admitting: Family Medicine

## 2022-09-27 DIAGNOSIS — I10 Essential (primary) hypertension: Secondary | ICD-10-CM

## 2022-09-27 NOTE — Telephone Encounter (Signed)
Unable to refill per protocol, Rx expired. Discontinued 07/27/22, allergic reaction.  Requested Prescriptions  Pending Prescriptions Disp Refills   amLODipine (NORVASC) 2.5 MG tablet [Pharmacy Med Name: AMLODIPINE BESYLATE 2.'5MG'$  TABLETS] 90 tablet 0    Sig: TAKE 1 TABLET(2.5 MG) BY MOUTH DAILY     Cardiovascular: Calcium Channel Blockers 2 Passed - 09/27/2022  3:32 AM      Passed - Last BP in normal range    BP Readings from Last 1 Encounters:  07/27/22 134/80         Passed - Last Heart Rate in normal range    Pulse Readings from Last 1 Encounters:  07/27/22 82         Passed - Valid encounter within last 6 months    Recent Outpatient Visits           2 months ago Primary hypertension   [facility] [place], Deanna C, MD   3 months ago Primary hypertension   [facility] [place], Deanna C, MD   3 months ago Primary hypertension   [place] [place], Deanna C, MD   1 year ago Dyslipidemia   [facility] [place], Deanna C, MD   3 years ago Establishing care with new doctor, encounter for   [place] [facility] [place], Deanna C, MD

## 2022-11-27 ENCOUNTER — Other Ambulatory Visit: Payer: Self-pay

## 2022-11-27 DIAGNOSIS — I10 Essential (primary) hypertension: Secondary | ICD-10-CM

## 2022-11-27 MED ORDER — HYDROCHLOROTHIAZIDE 12.5 MG PO TABS: 12.5000 mg | ORAL_TABLET | Freq: Every day | ORAL | 0 refills | Status: DC

## 2022-12-07 ENCOUNTER — Encounter: Payer: Self-pay | Admitting: Family Medicine

## 2022-12-07 ENCOUNTER — Ambulatory Visit: Payer: Medicaid Other | Admitting: Family Medicine

## 2022-12-07 VITALS — BP 144/78 | HR 74 | Ht 67.0 in | Wt 177.0 lb

## 2022-12-07 DIAGNOSIS — I10 Essential (primary) hypertension: Secondary | ICD-10-CM | POA: Diagnosis not present

## 2022-12-07 MED ORDER — HYDROCHLOROTHIAZIDE 12.5 MG PO TABS: 12.5000 mg | ORAL_TABLET | Freq: Every day | ORAL | 0 refills | Status: DC

## 2022-12-07 MED ORDER — AMLODIPINE BESYLATE 2.5 MG PO TABS: 2.5000 mg | ORAL_TABLET | Freq: Every day | ORAL | 0 refills | Status: DC

## 2022-12-07 NOTE — Patient Instructions (Signed)
Managing Your Hypertension Hypertension, also called high blood pressure, is when the force of the blood pressing against the walls of the arteries is too strong. Arteries are blood vessels that carry blood from your heart throughout your body. Hypertension forces the heart to work harder to pump blood and may cause the arteries to become narrow or stiff. Understanding blood pressure readings A blood pressure reading includes a higher number over a lower number: The first, or top, number is called the systolic pressure. It is a measure of the pressure in your arteries as your heart beats. The second, or bottom number, is called the diastolic pressure. It is a measure of the pressure in your arteries as the heart relaxes. For most people, a normal blood pressure is below 120/80. Your personal target blood pressure may vary depending on your medical conditions, your age, and other factors. Blood pressure is classified into four stages. Based on your blood pressure reading, your health care provider may use the following stages to determine what type of treatment you need, if any. Systolic pressure and diastolic pressure are measured in a unit called millimeters of mercury (mmHg). Normal Systolic pressure: below 120. Diastolic pressure: below 80. Elevated Systolic pressure: 120-129. Diastolic pressure: below 80. Hypertension stage 1 Systolic pressure: 130-139. Diastolic pressure: 80-89. Hypertension stage 2 Systolic pressure: 140 or above. Diastolic pressure: 90 or above. How can this condition affect me? Managing your hypertension is very important. Over time, hypertension can damage the arteries and decrease blood flow to parts of the body, including the brain, heart, and kidneys. Having untreated or uncontrolled hypertension can lead to: A heart attack. A stroke. A weakened blood vessel (aneurysm). Heart failure. Kidney damage. Eye damage. Memory and concentration problems. Vascular  dementia. What actions can I take to manage this condition? Hypertension can be managed by making lifestyle changes and possibly by taking medicines. Your health care provider will help you make a plan to bring your blood pressure within a normal range. You may be referred for counseling on a healthy diet and physical activity. Nutrition  Eat a diet that is high in fiber and potassium, and low in salt (sodium), added sugar, and fat. An example eating plan is called the DASH diet. DASH stands for Dietary Approaches to Stop Hypertension. To eat this way: Eat plenty of fresh fruits and vegetables. Try to fill one-half of your plate at each meal with fruits and vegetables. Eat whole grains, such as whole-wheat pasta, brown rice, or whole-grain bread. Fill about one-fourth of your plate with whole grains. Eat low-fat dairy products. Avoid fatty cuts of meat, processed or cured meats, and poultry with skin. Fill about one-fourth of your plate with lean proteins such as fish, chicken without skin, beans, eggs, and tofu. Avoid pre-made and processed foods. These tend to be higher in sodium, added sugar, and fat. Reduce your daily sodium intake. Many people with hypertension should eat less than 1,500 mg of sodium a day. Lifestyle  Work with your health care provider to maintain a healthy body weight or to lose weight. Ask what an ideal weight is for you. Get at least 30 minutes of exercise that causes your heart to beat faster (aerobic exercise) most days of the week. Activities may include walking, swimming, or biking. Include exercise to strengthen your muscles (resistance exercise), such as weight lifting, as part of your weekly exercise routine. Try to do these types of exercises for 30 minutes at least 3 days a week. Do   not use any products that contain nicotine or tobacco. These products include cigarettes, chewing tobacco, and vaping devices, such as e-cigarettes. If you need help quitting, ask your  health care provider. Control any long-term (chronic) conditions you have, such as high cholesterol or diabetes. Identify your sources of stress and find ways to manage stress. This may include meditation, deep breathing, or making time for fun activities. Alcohol use Do not drink alcohol if: Your health care provider tells you not to drink. You are pregnant, may be pregnant, or are planning to become pregnant. If you drink alcohol: Limit how much you have to: 0-1 drink a day for women. 0-2 drinks a day for men. Know how much alcohol is in your drink. In the [place] one drink equals one 12 oz bottle of beer (355 mL), one 5 oz glass of wine (148 mL), or one 1 oz glass of hard liquor (44 mL). Medicines Your health care provider may prescribe medicine if lifestyle changes are not enough to get your blood pressure under control and if: Your systolic blood pressure is 130 or higher. Your diastolic blood pressure is 80 or higher. Take medicines only as told by your health care provider. Follow the directions carefully. Blood pressure medicines must be taken as told by your health care provider. The medicine does not work as well when you skip doses. Skipping doses also puts you at risk for problems. Monitoring Before you monitor your blood pressure: Do not smoke, drink caffeinated beverages, or exercise within 30 minutes before taking a measurement. Use the bathroom and empty your bladder (urinate). Sit quietly for at least 5 minutes before taking measurements. Monitor your blood pressure at home as told by your health care provider. To do this: Sit with your back straight and supported. Place your feet flat on the floor. Do not cross your legs. Support your arm on a flat surface, such as a table. Make sure your upper arm is at heart level. Each time you measure, take two or three readings one minute apart and record the results. You may also need to have your blood pressure checked regularly by  your health care provider. General information Talk with your health care provider about your diet, exercise habits, and other lifestyle factors that may be contributing to hypertension. Review all the medicines you take with your health care provider because there may be side effects or interactions. Keep all follow-up visits. Your health care provider can help you create and adjust your plan for managing your high blood pressure. Where to find more information National Heart, Lung, and Blood Institute: www.nhlbi.nih.gov American Heart Association: www.heart.org Contact a health care provider if: You think you are having a reaction to medicines you have taken. You have repeated (recurrent) headaches. You feel dizzy. You have swelling in your ankles. You have trouble with your vision. Get help right away if: You develop a severe headache or confusion. You have unusual weakness or numbness, or you feel faint. You have severe pain in your chest or abdomen. You vomit repeatedly. You have trouble breathing. These symptoms may be an emergency. Get help right away. Call 911. Do not wait to see if the symptoms will go away. Do not drive yourself to the hospital. Summary Hypertension is when the force of blood pumping through your arteries is too strong. If this condition is not controlled, it may put you at risk for serious complications. Your personal target blood pressure may vary depending on your medical conditions,   your age, and other factors. For most people, a normal blood pressure is less than 120/80. Hypertension is managed by lifestyle changes, medicines, or both. Lifestyle changes to help manage hypertension include losing weight, eating a healthy, low-sodium diet, exercising more, stopping smoking, and limiting alcohol. This information is not intended to replace advice given to you by your health care provider. Make sure you discuss any questions you have with your health care  provider. Document Revised: 03/16/2021 Document Reviewed: 03/16/2021 Elsevier Patient Education  2024 Elsevier Inc.  

## 2022-12-07 NOTE — Progress Notes (Signed)
Date:  12/07/2022   Name:  Veronica Berg   DOB:  1975/10/14   MRN:  [id number]   Chief Complaint: Hypertension  Hypertension This is a chronic problem. The current episode started more than 1 month ago. The problem has been gradually improving since onset. The problem is controlled. Pertinent negatives include no anxiety, chest pain, headaches, orthopnea, palpitations, PND or shortness of breath. There are no associated agents to hypertension. Past treatments include calcium channel blockers and diuretics. The current treatment provides moderate improvement. There are no compliance problems.  There is no history of angina, kidney disease, CAD/MI, CVA, heart failure, left ventricular hypertrophy, PVD or retinopathy. There is no history of chronic renal disease, a hypertension causing med or renovascular disease.    Lab Results  Component Value Date   NA 138 09/14/2021   K 4.1 09/14/2021   CO2 22 09/14/2021   GLUCOSE 92 09/14/2021   BUN 13 09/14/2021   CREATININE 0.79 09/14/2021   CALCIUM 8.9 09/14/2021   EGFR 93 09/14/2021   GFRNONAA 110 05/19/2019   Lab Results  Component Value Date   CHOL 171 09/14/2021   HDL 43 09/14/2021   LDLCALC 119 (H) 09/14/2021   TRIG 45 09/14/2021   Lab Results  Component Value Date   TSH 1.320 05/19/2019   Lab Results  Component Value Date   HGBA1C 5.4 09/14/2021   Lab Results  Component Value Date   WBC 7.3 09/14/2021   HGB 11.5 09/14/2021   HCT 35.0 09/14/2021   MCV 90 09/14/2021   PLT 276 09/14/2021   No results found for: "ALT", "AST", "GGT", "ALKPHOS", "BILITOT" No results found for: "25OHVITD2", "25OHVITD3", "VD25OH"   Review of Systems  Constitutional:  Negative for fever and unexpected weight change.  Respiratory:  Negative for shortness of breath and wheezing.   Cardiovascular:  Negative for chest pain, palpitations, orthopnea, leg swelling and PND.  Neurological:  Negative for headaches.    There are no problems to  display for this patient.   No Active Allergies  Past Surgical History:  Procedure Laterality Date   NO PAST SURGERIES      Social History   Tobacco Use   Smoking status: Former   Smokeless tobacco: Never  Vaping Use   Vaping Use: Never used  Substance Use Topics   Alcohol use: Yes    Comment: occasionally   Drug use: No     Medication list has been reviewed and updated.  Current Meds  Medication Sig   b complex vitamins tablet Take 1 tablet by mouth daily.   hydrochlorothiazide (HYDRODIURIL) 12.5 MG tablet Take 1 tablet (12.5 mg total) by mouth daily.       12/07/2022    3:48 PM 07/27/2022    3:37 PM 06/01/2022    2:02 PM 09/14/2021    3:57 PM  GAD 7 : Generalized Anxiety Score  Nervous, Anxious, on Edge 0 0 0 0  Control/stop worrying 0 0 1 0  Worry too much - different things 0 0 1 0  Trouble relaxing 0 0 0 0  Restless 0 0 0 0  Easily annoyed or irritable 0 0 0 0  Afraid - awful might happen 0 0 0 0  Total GAD 7 Score 0 0 2 0  Anxiety Difficulty Not difficult at all Not difficult at all Somewhat difficult Not difficult at all       12/07/2022    3:48 PM 07/27/2022    3:37 PM  06/01/2022    2:01 PM  Depression screen PHQ 2/9  Decreased Interest 0 0 0  Down, Depressed, Hopeless 0 0 0  PHQ - 2 Score 0 0 0  Altered sleeping 0 0 0  Tired, decreased energy 0 0 0  Change in appetite 0 0 0  Feeling bad or failure about yourself  0 0 0  Trouble concentrating 0 0 0  Moving slowly or fidgety/restless 0 0 0  Suicidal thoughts 0 0 0  PHQ-9 Score 0 0 0  Difficult doing work/chores Not difficult at all Not difficult at all Not difficult at all    BP Readings from Last 3 Encounters:  12/07/22 (!) 144/78  07/27/22 134/80  07/02/22 (!) 134/94    Physical Exam Vitals and nursing note reviewed. Exam conducted with a chaperone present.  Constitutional:      General: She is not in acute distress.    Appearance: She is not diaphoretic.  HENT:     Head:  Normocephalic and atraumatic.     Right Ear: External ear normal.     Left Ear: External ear normal.     Nose: Nose normal.     Mouth/Throat:     Mouth: Mucous membranes are moist.  Eyes:     General:        Right eye: No discharge.        Left eye: No discharge.     Conjunctiva/sclera: Conjunctivae normal.     Pupils: Pupils are equal, round, and reactive to light.  Neck:     Thyroid: No thyromegaly.     Vascular: No JVD.  Cardiovascular:     Rate and Rhythm: Normal rate and regular rhythm.     Heart sounds: Normal heart sounds. No murmur heard.    No friction rub. No gallop.  Pulmonary:     Effort: Pulmonary effort is normal.     Breath sounds: Normal breath sounds. No wheezing, rhonchi or rales.  Abdominal:     General: Bowel sounds are normal.     Palpations: Abdomen is soft. There is no mass.     Tenderness: There is no abdominal tenderness. There is no guarding.  Musculoskeletal:        General: Normal range of motion.     Cervical back: Normal range of motion and neck supple.  Lymphadenopathy:     Cervical: No cervical adenopathy.  Skin:    General: Skin is warm and dry.  Neurological:     Mental Status: She is alert.     Wt Readings from Last 3 Encounters:  12/07/22 177 lb (80.3 kg)  07/27/22 174 lb (78.9 kg)  07/02/22 175 lb (79.4 kg)    BP (!) 144/78   Pulse 74   Ht 5\' 7"  (1.702 m)   Wt 177 lb (80.3 kg)   LMP 11/22/2022 (Exact Date)   SpO2 99%   BMI 27.72 kg/m   Assessment and Plan:  1. Primary hypertension Chronic.  Uncontrolled.  Stable.  Patient at first was on hydrochlorothiazide and amlodipine but shortly afterwards she says she did not feel well on amlodipine and would like a change so we switched to carvedilol.  Patient felt even worse on the carvedilol and she returned to amlodipine 2.5 mg once a day but ran out approximately a week ago with only taking hydrochlorothiazide today.  Blood pressure today is 144/78.  We have discussed the  different stages of hypertension and that she is still in stage II hypertension  and patient agrees to continue on the combination of amlodipine 2.5 mg and hydrochlorothiazide 12.5 mg and we will recheck in 6 weeks.  Patient is also been discussed about lowering sodium intake as well is maintaining weight.  Patient will return in 6 weeks we will recheck blood pressure and hopefully this will be in normal range and we will check labs at that time. - amLODipine (NORVASC) 2.5 MG tablet; Take 1 tablet (2.5 mg total) by mouth daily.  Dispense: 90 tablet; Refill: 0 - hydrochlorothiazide (HYDRODIURIL) 12.5 MG tablet; Take 1 tablet (12.5 mg total) by mouth daily.  Dispense: 90 tablet; Refill: 0    Elizabeth Sauer, MD

## 2023-01-14 ENCOUNTER — Ambulatory Visit: Payer: Medicaid Other | Admitting: Family Medicine

## 2023-01-14 ENCOUNTER — Encounter: Payer: Self-pay | Admitting: Family Medicine

## 2023-01-14 VITALS — BP 132/78 | HR 68 | Ht 67.0 in | Wt 178.0 lb

## 2023-01-14 DIAGNOSIS — I10 Essential (primary) hypertension: Secondary | ICD-10-CM

## 2023-01-14 MED ORDER — HYDROCHLOROTHIAZIDE 12.5 MG PO TABS: 12.5000 mg | ORAL_TABLET | Freq: Every day | ORAL | 1 refills | Status: DC

## 2023-01-14 MED ORDER — AMLODIPINE BESYLATE 2.5 MG PO TABS: 2.5000 mg | ORAL_TABLET | Freq: Every day | ORAL | 1 refills | Status: DC

## 2023-01-14 MED ORDER — AMLODIPINE BESYLATE 2.5 MG PO TABS: 2.5000 mg | ORAL_TABLET | Freq: Every day | ORAL | 0 refills | Status: DC

## 2023-01-14 NOTE — Progress Notes (Signed)
Date:  01/14/2023   Name:  Veronica Berg   DOB:  December 21, 1975   MRN:  [id number]   Chief Complaint: Hypertension (Added Amlodipine last visit)  Hypertension This is a chronic problem. The current episode started more than 1 year ago. The problem has been gradually improving since onset. The problem is controlled. Pertinent negatives include no anxiety, blurred vision, chest pain, headaches, malaise/fatigue, neck pain, orthopnea, palpitations, peripheral edema, PND, shortness of breath or sweats. There are no associated agents to hypertension. There are no known risk factors for coronary artery disease. Past treatments include diuretics and calcium channel blockers. The current treatment provides moderate improvement. There are no compliance problems.  There is no history of angina, kidney disease, CAD/MI, CVA, heart failure, left ventricular hypertrophy, PVD or retinopathy. There is no history of chronic renal disease, a hypertension causing med or renovascular disease.    Lab Results  Component Value Date   NA 138 09/14/2021   K 4.1 09/14/2021   CO2 22 09/14/2021   GLUCOSE 92 09/14/2021   BUN 13 09/14/2021   CREATININE 0.79 09/14/2021   CALCIUM 8.9 09/14/2021   EGFR 93 09/14/2021   GFRNONAA 110 05/19/2019   Lab Results  Component Value Date   CHOL 171 09/14/2021   HDL 43 09/14/2021   LDLCALC 119 (H) 09/14/2021   TRIG 45 09/14/2021   Lab Results  Component Value Date   TSH 1.320 05/19/2019   Lab Results  Component Value Date   HGBA1C 5.4 09/14/2021   Lab Results  Component Value Date   WBC 7.3 09/14/2021   HGB 11.5 09/14/2021   HCT 35.0 09/14/2021   MCV 90 09/14/2021   PLT 276 09/14/2021   No results found for: "ALT", "AST", "GGT", "ALKPHOS", "BILITOT" No results found for: "25OHVITD2", "25OHVITD3", "VD25OH"   Review of Systems  Constitutional:  Negative for malaise/fatigue and unexpected weight change.  HENT:  Negative for congestion, sinus pressure, sneezing  and trouble swallowing.   Eyes:  Negative for blurred vision, photophobia, pain and redness.  Respiratory:  Negative for chest tightness, shortness of breath and wheezing.   Cardiovascular:  Negative for chest pain, palpitations, orthopnea and PND.  Gastrointestinal:  Negative for abdominal distention.  Endocrine: Negative for polydipsia and polyuria.  Genitourinary:  Negative for difficulty urinating.  Musculoskeletal:  Negative for neck pain.  Neurological:  Negative for headaches.    There are no problems to display for this patient.   Allergies  Allergen Reactions   Carvedilol     "Chest tightness"    Past Surgical History:  Procedure Laterality Date   NO PAST SURGERIES      Social History   Tobacco Use   Smoking status: Former   Smokeless tobacco: Never  Vaping Use   Vaping Use: Never used  Substance Use Topics   Alcohol use: Yes    Comment: occasionally   Drug use: No     Medication list has been reviewed and updated.  Current Meds  Medication Sig   amLODipine (NORVASC) 2.5 MG tablet Take 1 tablet (2.5 mg total) by mouth daily.   b complex vitamins tablet Take 1 tablet by mouth daily.   hydrochlorothiazide (HYDRODIURIL) 12.5 MG tablet Take 1 tablet (12.5 mg total) by mouth daily.       01/14/2023    4:17 PM 12/07/2022    3:48 PM 07/27/2022    3:37 PM 06/01/2022    2:02 PM  GAD 7 : Generalized Anxiety Score  Nervous, Anxious, on Edge 0 0 0 0  Control/stop worrying 0 0 0 1  Worry too much - different things 0 0 0 1  Trouble relaxing 0 0 0 0  Restless 0 0 0 0  Easily annoyed or irritable 0 0 0 0  Afraid - awful might happen 0 0 0 0  Total GAD 7 Score 0 0 0 2  Anxiety Difficulty Not difficult at all Not difficult at all Not difficult at all Somewhat difficult       01/14/2023    4:17 PM 12/07/2022    3:48 PM 07/27/2022    3:37 PM  Depression screen PHQ 2/9  Decreased Interest 0 0 0  Down, Depressed, Hopeless 0 0 0  PHQ - 2 Score 0 0 0  Altered  sleeping 0 0 0  Tired, decreased energy 0 0 0  Change in appetite 0 0 0  Feeling bad or failure about yourself  0 0 0  Trouble concentrating 0 0 0  Moving slowly or fidgety/restless 0 0 0  Suicidal thoughts 0 0 0  PHQ-9 Score 0 0 0  Difficult doing work/chores Not difficult at all Not difficult at all Not difficult at all    BP Readings from Last 3 Encounters:  01/14/23 132/78  12/07/22 (!) 144/78  07/27/22 134/80    Physical Exam Vitals and nursing note reviewed. Exam conducted with a chaperone present.  Constitutional:      General: She is not in acute distress.    Appearance: She is not diaphoretic.  HENT:     Head: Normocephalic and atraumatic.     Right Ear: External ear normal.     Left Ear: External ear normal.     Nose: Nose normal.     Mouth/Throat:     Mouth: Mucous membranes are moist.     Pharynx: No oropharyngeal exudate or posterior oropharyngeal erythema.  Eyes:     General:        Right eye: No discharge.        Left eye: No discharge.     Conjunctiva/sclera: Conjunctivae normal.     Pupils: Pupils are equal, round, and reactive to light.  Neck:     Thyroid: No thyromegaly.     Vascular: No JVD.  Cardiovascular:     Rate and Rhythm: Normal rate and regular rhythm.     Heart sounds: Normal heart sounds. No murmur heard.    No friction rub. No gallop.  Pulmonary:     Effort: Pulmonary effort is normal.     Breath sounds: Normal breath sounds. No wheezing, rhonchi or rales.  Abdominal:     General: Bowel sounds are normal.     Palpations: Abdomen is soft. There is no mass.     Tenderness: There is no abdominal tenderness. There is no guarding.  Musculoskeletal:        General: Normal range of motion.     Cervical back: Normal range of motion and neck supple.  Lymphadenopathy:     Cervical: No cervical adenopathy.  Skin:    General: Skin is warm and dry.  Neurological:     Mental Status: She is alert.     Deep Tendon Reflexes: Reflexes are normal  and symmetric.     Wt Readings from Last 3 Encounters:  01/14/23 178 lb (80.7 kg)  12/07/22 177 lb (80.3 kg)  07/27/22 174 lb (78.9 kg)    BP 132/78   Pulse 68   Ht 5'  7" (1.702 m)   Wt 178 lb (80.7 kg)   LMP 12/25/2022 (Exact Date)   SpO2 99%   BMI 27.88 kg/m   Assessment and Plan:  1. Primary hypertension Chronic.  Controlled.  Stable.  Blood pressure today is 132/78.  Asymptomatic.  Tolerating medications well.  Continue hydrochlorothiazide 12.5 mg once a day and amlodipine 2.5 mg once a day.  Will check renal function panel for electrolytes and GFR.  Will recheck patient in 6 months. - hydrochlorothiazide (HYDRODIURIL) 12.5 MG tablet; Take 1 tablet (12.5 mg total) by mouth daily.  Dispense: 90 tablet; Refill: 1 - amLODipine (NORVASC) 2.5 MG tablet; Take 1 tablet (2.5 mg total) by mouth daily.  Dispense: 90 tablet; Refill: 1 - Renal Function Panel    Elizabeth Sauer, MD

## 2023-01-15 LAB — RENAL FUNCTION PANEL
Albumin: 4.5 g/dL (ref 3.9–4.9)
BUN/Creatinine Ratio: 22 (ref 9–23)
BUN: 14 mg/dL (ref 6–24)
CO2: 25 mmol/L (ref 20–29)
Calcium: 9.1 mg/dL (ref 8.7–10.2)
Chloride: 99 mmol/L (ref 96–106)
Creatinine, Ser: 0.64 mg/dL (ref 0.57–1.00)
Glucose: 85 mg/dL (ref 70–99)
Phosphorus: 3.9 mg/dL (ref 3.0–4.3)
Potassium: 4.1 mmol/L (ref 3.5–5.2)
Sodium: 136 mmol/L (ref 134–144)
eGFR: 110 mL/min/{1.73_m2} (ref >59)

## 2023-01-18 ENCOUNTER — Ambulatory Visit: Payer: Medicaid Other | Admitting: Family Medicine

## 2023-03-07 ENCOUNTER — Other Ambulatory Visit: Payer: Self-pay | Admitting: Family Medicine

## 2023-03-07 DIAGNOSIS — I10 Essential (primary) hypertension: Secondary | ICD-10-CM

## 2023-03-07 NOTE — Telephone Encounter (Signed)
Requested by interface surescripts. Future visit in 4 months.  Requested Prescriptions  Pending Prescriptions Disp Refills   hydrochlorothiazide (HYDRODIURIL) 12.5 MG tablet [Pharmacy Med Name: HYDROCHLOROTHIAZIDE 12.5MG  TABLETS] 90 tablet 1    Sig: TAKE 1 TABLET(12.5 MG) BY MOUTH DAILY     Cardiovascular: Diuretics - Thiazide Passed - 03/07/2023  8:33 AM      Passed - Cr in normal range and within 180 days    Creatinine, Ser  Date Value Ref Range Status  01/14/2023 0.64 0.57 - 1.00 mg/dL Final         Passed - K in normal range and within 180 days    Potassium  Date Value Ref Range Status  01/14/2023 4.1 3.5 - 5.2 mmol/L Final         Passed - Na in normal range and within 180 days    Sodium  Date Value Ref Range Status  01/14/2023 136 134 - 144 mmol/L Final         Passed - Last BP in normal range    BP Readings from Last 1 Encounters:  01/14/23 132/78         Passed - Valid encounter within last 6 months    Recent Outpatient Visits           1 month ago Primary hypertension   [place] [facility]   3 months ago Primary hypertension   [place] [facility]   7 months ago Primary hypertension   [place] [facility]   8 months ago Primary hypertension   [place] [facility]   9 months ago Primary hypertension   [place] [facility]       Future Appointments             In 4 months Duanne Limerick, MD [facility]             amLODipine (NORVASC) 2.5 MG tablet [Pharmacy Med Name: AMLODIPINE BESYLATE 2.5MG  TABLETS] 90 tablet 1    Sig: TAKE 1 TABLET(2.5 MG) BY MOUTH DAILY     Cardiovascular: Calcium  Channel Blockers 2 Passed - 03/07/2023  8:33 AM      Passed - Last BP in normal range    BP Readings from Last 1 Encounters:  01/14/23 132/78         Passed - Last Heart Rate in normal range    Pulse Readings from Last 1 Encounters:  01/14/23 68         Passed - Valid encounter within last 6 months    Recent Outpatient Visits           1 month ago Primary hypertension   [place] [facility]   3 months ago Primary hypertension   [place] [facility], [facility]   7 months ago Primary hypertension   [place] [facility], MD   8 months ago Primary hypertension   [facility] at [facility] [place]  C, MD   9 months ago Primary hypertension   [place] [facility]       Future Appointments             In 4 months Duanne Limerick, MD [facility] at [facility], [facility]

## 2023-04-19 ENCOUNTER — Encounter: Payer: Self-pay | Admitting: Family Medicine

## 2023-04-21 ENCOUNTER — Other Ambulatory Visit: Payer: Self-pay | Admitting: Family Medicine

## 2023-04-21 DIAGNOSIS — I10 Essential (primary) hypertension: Secondary | ICD-10-CM

## 2023-04-25 DIAGNOSIS — N939 Abnormal uterine and vaginal bleeding, unspecified: Secondary | ICD-10-CM | POA: Diagnosis not present

## 2023-04-25 DIAGNOSIS — Z30011 Encounter for initial prescription of contraceptive pills: Secondary | ICD-10-CM | POA: Diagnosis not present

## 2023-04-25 DIAGNOSIS — I10 Essential (primary) hypertension: Secondary | ICD-10-CM | POA: Insufficient documentation

## 2023-04-25 DIAGNOSIS — E785 Hyperlipidemia, unspecified: Secondary | ICD-10-CM | POA: Insufficient documentation

## 2023-04-25 DIAGNOSIS — N951 Menopausal and female climacteric states: Secondary | ICD-10-CM | POA: Diagnosis not present

## 2023-07-30 ENCOUNTER — Encounter: Payer: Self-pay | Admitting: Family Medicine

## 2023-07-30 ENCOUNTER — Ambulatory Visit: Payer: Medicaid Other | Admitting: Family Medicine

## 2023-07-30 VITALS — BP 110/68 | HR 66 | Ht 67.0 in | Wt 183.0 lb

## 2023-07-30 DIAGNOSIS — I10 Essential (primary) hypertension: Secondary | ICD-10-CM

## 2023-07-30 MED ORDER — AMLODIPINE BESYLATE 2.5 MG PO TABS: 2.5000 mg | ORAL_TABLET | Freq: Every day | ORAL | 1 refills | Status: DC

## 2023-07-30 MED ORDER — HYDROCHLOROTHIAZIDE 12.5 MG PO TABS: 12.5000 mg | ORAL_TABLET | Freq: Every day | ORAL | 1 refills | Status: DC

## 2023-07-30 NOTE — Progress Notes (Signed)
 Date:  07/30/2023   Name:  Veronica Berg   DOB:  [birth year]   MRN:  [id number]   Chief Complaint: Hypertension (Patient presents today for a refill on her amlodipine  and hydrochlorothiazide . )  Hypertension This is a chronic problem. The current episode started more than 1 year ago. The problem has been gradually improving since onset. The problem is controlled. Pertinent negatives include no anxiety, blurred vision, chest pain, headaches, malaise/fatigue, neck pain, orthopnea, palpitations, peripheral edema, PND, shortness of breath or sweats. There are no associated agents to hypertension. Past treatments include diuretics and calcium channel blockers. The current treatment provides moderate improvement. There are no compliance problems.  There is no history of angina, kidney disease, CAD/MI, CVA, heart failure, left ventricular hypertrophy, PVD or retinopathy. There is no history of chronic renal disease, a hypertension causing med or renovascular disease.    Lab Results  Component Value Date   NA 136 01/14/2023   K 4.1 01/14/2023   CO2 25 01/14/2023   GLUCOSE 85 01/14/2023   BUN 14 01/14/2023   CREATININE 0.64 01/14/2023   CALCIUM 9.1 01/14/2023   EGFR 110 01/14/2023   GFRNONAA 110 05/19/2019   Lab Results  Component Value Date   CHOL 171 09/14/2021   HDL 43 09/14/2021   LDLCALC 119 (H) 09/14/2021   TRIG 45 09/14/2021   Lab Results  Component Value Date   TSH 1.320 05/19/2019   Lab Results  Component Value Date   HGBA1C 5.4 09/14/2021   Lab Results  Component Value Date   WBC 7.3 09/14/2021   HGB 11.5 09/14/2021   HCT 35.0 09/14/2021   MCV 90 09/14/2021   PLT 276 09/14/2021   No results found for: ALT, AST, GGT, ALKPHOS, BILITOT No results found for: 25OHVITD2, 25OHVITD3, VD25OH   Review of Systems  Constitutional:  Negative for malaise/fatigue.  Eyes:  Negative for blurred vision and visual disturbance.  Respiratory:  Negative for cough,  choking, chest tightness, shortness of breath, wheezing and stridor.   Cardiovascular:  Negative for chest pain, palpitations, orthopnea, leg swelling and PND.  Gastrointestinal:  Negative for abdominal pain, blood in stool, constipation and diarrhea.  Musculoskeletal:  Negative for neck pain.  Neurological:  Negative for headaches.    There are no active problems to display for this patient.   Allergies  Allergen Reactions   Carvedilol      Chest tightness    Past Surgical History:  Procedure Laterality Date   NO PAST SURGERIES      Social History   Tobacco Use   Smoking status: Former   Smokeless tobacco: Never  Vaping Use   Vaping status: Never Used  Substance Use Topics   Alcohol use: Yes    Comment: occasionally   Drug use: No     Medication list has been reviewed and updated.  Current Meds  Medication Sig   amLODipine  (NORVASC ) 2.5 MG tablet TAKE 1 TABLET(2.5 MG) BY MOUTH DAILY   b complex vitamins tablet Take 1 tablet by mouth daily.   hydrochlorothiazide  (HYDRODIURIL ) 12.5 MG tablet TAKE 1 TABLET(12.5 MG) BY MOUTH DAILY       07/30/2023    3:58 PM 01/14/2023    4:17 PM 12/07/2022    3:48 PM 07/27/2022    3:37 PM  GAD 7 : Generalized Anxiety Score  Nervous, Anxious, on Edge 0 0 0 0  Control/stop worrying 0 0 0 0  Worry too much - different things 0 0 0 0  Trouble relaxing 0 0 0 0  Restless 0 0 0 0  Easily annoyed or irritable 0 0 0 0  Afraid - awful might happen 0 0 0 0  Total GAD 7 Score 0 0 0 0  Anxiety Difficulty Not difficult at all Not difficult at all Not difficult at all Not difficult at all       07/30/2023    3:58 PM 01/14/2023    4:17 PM 12/07/2022    3:48 PM  Depression screen PHQ 2/9  Decreased Interest 0 0 0  Down, Depressed, Hopeless 0 0 0  PHQ - 2 Score 0 0 0  Altered sleeping 0 0 0  Tired, decreased energy 0 0 0  Change in appetite 0 0 0  Feeling bad or failure about yourself  0 0 0  Trouble concentrating 0 0 0  Moving slowly  or fidgety/restless 0 0 0  Suicidal thoughts 0 0 0  PHQ-9 Score 0 0 0  Difficult doing work/chores Not difficult at all Not difficult at all Not difficult at all    BP Readings from Last 3 Encounters:  07/30/23 110/68  01/14/23 132/78  12/07/22 (!) 144/78    Physical Exam Vitals and nursing note reviewed.  Constitutional:      General: She is not in acute distress.    Appearance: She is not diaphoretic.  HENT:     Head: Normocephalic and atraumatic.     Right Ear: Tympanic membrane and external ear normal. There is no impacted cerumen.     Left Ear: Tympanic membrane and external ear normal. There is no impacted cerumen.     Nose: Nose normal. No congestion or rhinorrhea.     Mouth/Throat:     Pharynx: Oropharynx is clear. No oropharyngeal exudate or posterior oropharyngeal erythema.  Eyes:     General:        Right eye: No discharge.        Left eye: No discharge.     Conjunctiva/sclera: Conjunctivae normal.     Pupils: Pupils are equal, round, and reactive to light.  Neck:     Thyroid: No thyromegaly.     Vascular: No JVD.  Cardiovascular:     Rate and Rhythm: Normal rate and regular rhythm.     Heart sounds: Normal heart sounds. No murmur heard.    No friction rub. No gallop.  Pulmonary:     Effort: Pulmonary effort is normal.     Breath sounds: Normal breath sounds. No wheezing, rhonchi or rales.  Chest:     Chest wall: No tenderness.  Abdominal:     General: Bowel sounds are normal.     Palpations: Abdomen is soft. There is no mass.     Tenderness: There is no abdominal tenderness. There is no guarding or rebound.  Musculoskeletal:        General: Normal range of motion.     Cervical back: Normal range of motion and neck supple.  Lymphadenopathy:     Cervical: No cervical adenopathy.  Skin:    General: Skin is warm.  Neurological:     Mental Status: She is alert.     Deep Tendon Reflexes: Reflexes are normal and symmetric.     Wt Readings from Last 3  Encounters:  07/30/23 183 lb (83 kg)  01/14/23 178 lb (80.7 kg)  12/07/22 177 lb (80.3 kg)    BP 110/68   Pulse 66   Ht 5' 7 (1.702 m)   Wt 183  lb (83 kg)   SpO2 99%   BMI 28.66 kg/m   Assessment and Plan: 1. Primary hypertension (Primary) 12.  Stable.  Blood pressure today is 110/68.  Asymptomatic.  Tolerating medication well.  Continue amlodipine  2.5 mg once a day and hydrochlorothiazide  12.5 mg once a day.  Will obtain renal function panel for electrolytes and GFR.  Will recheck patient in 6 months. - amLODipine  (NORVASC ) 2.5 MG tablet; Take 1 tablet (2.5 mg total) by mouth daily.  Dispense: 90 tablet; Refill: 1 - hydrochlorothiazide  (HYDRODIURIL ) 12.5 MG tablet; Take 1 tablet (12.5 mg total) by mouth daily.  Dispense: 90 tablet; Refill: 1 - Renal Function Panel     Cathryne Molt, MD

## 2023-07-31 LAB — RENAL FUNCTION PANEL
Albumin: 4.6 g/dL (ref 3.9–4.9)
BUN/Creatinine Ratio: 24 — ABNORMAL HIGH (ref 9–23)
BUN: 15 mg/dL (ref 6–24)
CO2: 26 mmol/L (ref 20–29)
Calcium: 9.2 mg/dL (ref 8.7–10.2)
Chloride: 97 mmol/L (ref 96–106)
Creatinine, Ser: 0.62 mg/dL (ref 0.57–1.00)
Glucose: 84 mg/dL (ref 70–99)
Phosphorus: 3.6 mg/dL (ref 3.0–4.3)
Potassium: 3.9 mmol/L (ref 3.5–5.2)
Sodium: 137 mmol/L (ref 134–144)
eGFR: 110 mL/min/{1.73_m2} (ref >59)

## 2023-08-02 ENCOUNTER — Encounter: Payer: Self-pay | Admitting: Family Medicine

## 2023-11-12 ENCOUNTER — Encounter: Payer: Self-pay | Admitting: Family Medicine

## 2023-11-12 ENCOUNTER — Telehealth: Payer: Self-pay | Admitting: Family Medicine

## 2023-11-12 ENCOUNTER — Ambulatory Visit: Admitting: Family Medicine

## 2023-11-12 VITALS — BP 116/76 | HR 88 | Ht 67.0 in | Wt 170.0 lb

## 2023-11-12 DIAGNOSIS — Z0184 Encounter for antibody response examination: Secondary | ICD-10-CM | POA: Diagnosis not present

## 2023-11-12 DIAGNOSIS — Z23 Encounter for immunization: Secondary | ICD-10-CM | POA: Diagnosis not present

## 2023-11-12 NOTE — Telephone Encounter (Signed)
 Copied from CRM [id number]. Topic: Medical Record Request - Records Request >> Nov 12, 2023  9:01 AM Juluis Ok wrote: Reason for CRM: Patient is requesting a copy of immunizations records.  Callback [phone]

## 2023-11-12 NOTE — Progress Notes (Signed)
 Date:  11/12/2023   Name:  Veronica Berg   DOB:  1976-05-15   MRN:  [id number]   Chief Complaint: Medical Management of Chronic Issues (Patient presents today to get order for titers to see what she is immune to for a class she is taking. )  HPI  Lab Results  Component Value Date   NA 137 07/30/2023   K 3.9 07/30/2023   CO2 26 07/30/2023   GLUCOSE 84 07/30/2023   BUN 15 07/30/2023   CREATININE 0.62 07/30/2023   CALCIUM 9.2 07/30/2023   EGFR 110 07/30/2023   GFRNONAA 110 05/19/2019   Lab Results  Component Value Date   CHOL 171 09/14/2021   HDL 43 09/14/2021   LDLCALC 119 (H) 09/14/2021   TRIG 45 09/14/2021   Lab Results  Component Value Date   TSH 1.320 05/19/2019   Lab Results  Component Value Date   HGBA1C 5.4 09/14/2021   Lab Results  Component Value Date   WBC 7.3 09/14/2021   HGB 11.5 09/14/2021   HCT 35.0 09/14/2021   MCV 90 09/14/2021   PLT 276 09/14/2021   No results found for: "ALT", "AST", "GGT", "ALKPHOS", "BILITOT" No results found for: "25OHVITD2", "25OHVITD3", "VD25OH"   Review of Systems  Eyes:  Negative for visual disturbance.  Respiratory:  Negative for shortness of breath and wheezing.   Cardiovascular:  Negative for chest pain, palpitations and leg swelling.  Gastrointestinal:  Negative for abdominal pain.    There are no active problems to display for this patient.   Allergies  Allergen Reactions   Carvedilol      "Chest tightness"    Past Surgical History:  Procedure Laterality Date   NO PAST SURGERIES      Social History   Tobacco Use   Smoking status: Former   Smokeless tobacco: Never  Vaping Use   Vaping status: Never Used  Substance Use Topics   Alcohol use: Yes    Comment: occasionally   Drug use: No     Medication list has been reviewed and updated.  Current Meds  Medication Sig   amLODipine  (NORVASC ) 2.5 MG tablet Take 1 tablet (2.5 mg total) by mouth daily.   b complex vitamins tablet Take 1  tablet by mouth daily.   hydrochlorothiazide  (HYDRODIURIL ) 12.5 MG tablet Take 1 tablet (12.5 mg total) by mouth daily.       07/30/2023    3:58 PM 01/14/2023    4:17 PM 12/07/2022    3:48 PM 07/27/2022    3:37 PM  GAD 7 : Generalized Anxiety Score  Nervous, Anxious, on Edge 0 0 0 0  Control/stop worrying 0 0 0 0  Worry too much - different things 0 0 0 0  Trouble relaxing 0 0 0 0  Restless 0 0 0 0  Easily annoyed or irritable 0 0 0 0  Afraid - awful might happen 0 0 0 0  Total GAD 7 Score 0 0 0 0  Anxiety Difficulty Not difficult at all Not difficult at all Not difficult at all Not difficult at all       07/30/2023    3:58 PM 01/14/2023    4:17 PM 12/07/2022    3:48 PM  Depression screen PHQ 2/9  Decreased Interest 0 0 0  Down, Depressed, Hopeless 0 0 0  PHQ - 2 Score 0 0 0  Altered sleeping 0 0 0  Tired, decreased energy 0 0 0  Change in appetite 0  0 0  Feeling bad or failure about yourself  0 0 0  Trouble concentrating 0 0 0  Moving slowly or fidgety/restless 0 0 0  Suicidal thoughts 0 0 0  PHQ-9 Score 0 0 0  Difficult doing work/chores Not difficult at all Not difficult at all Not difficult at all    BP Readings from Last 3 Encounters:  11/12/23 116/76  07/30/23 110/68  01/14/23 132/78    Physical Exam Cardiovascular:     Rate and Rhythm: Normal rate and regular rhythm.     Heart sounds: No murmur heard.    No friction rub. No gallop.  Pulmonary:     Breath sounds: No wheezing, rhonchi or rales.  Abdominal:     Palpations: There is no mass.     Tenderness: There is no abdominal tenderness. There is no guarding.     Wt Readings from Last 3 Encounters:  11/12/23 170 lb (77.1 kg)  07/30/23 183 lb (83 kg)  01/14/23 178 lb (80.7 kg)    BP 116/76   Pulse 88   Ht 5\' 7"  (1.702 m)   Wt 170 lb (77.1 kg)   SpO2 98%   BMI 26.63 kg/m   Assessment and Plan: 1. Immunity status testing (Primary) Patient is pursuing and may medical clinical associated location  past family needs antibodies check for upcoming instructional classes. - Hepatitis B surface antibody,qualitative - Varicella zoster antibody, IgG - QuantiFERON-TB Gold Plus - Measles/Mumps/Rubella Immunity  2. Need for diphtheria-tetanus-pertussis (Tdap) vaccine Is in need of Tdap and this and we will hold on influenza and that we do not have the available influenza for next season. - Tdap vaccine greater than or equal to 7yo IM     Alayne Allis, MD

## 2023-11-12 NOTE — Telephone Encounter (Signed)
 Pt scheduled for an appointment today for titers. No records of vaccines in [place].  KP

## 2023-11-15 LAB — MEASLES/MUMPS/RUBELLA IMMUNITY
MUMPS ABS, IGG: 58.4 [AU]/ml (ref >10.9)
RUBEOLA AB, IGG: <13.5 [AU]/ml — ABNORMAL LOW (ref >16.4)
Rubella Antibodies, IGG: 1.42 {index} (ref >0.99)

## 2023-11-15 LAB — QUANTIFERON-TB GOLD PLUS
QuantiFERON Mitogen Value: >10 [IU]/mL
QuantiFERON Nil Value: 0.04 [IU]/mL
QuantiFERON TB1 Ag Value: 0.05 [IU]/mL
QuantiFERON TB2 Ag Value: 0.05 [IU]/mL
QuantiFERON-TB Gold Plus: NEGATIVE

## 2023-11-15 LAB — VARICELLA ZOSTER ANTIBODY, IGG: Varicella zoster IgG: REACTIVE

## 2023-11-15 LAB — HEPATITIS B SURFACE ANTIBODY,QUALITATIVE: Hep B Surface Ab, Qual: NONREACTIVE

## 2023-11-15 NOTE — Progress Notes (Signed)
 Advised patient to get MMR Vaccine at [facility]

## 2023-11-20 ENCOUNTER — Ambulatory Visit: Payer: Self-pay

## 2023-11-20 ENCOUNTER — Ambulatory Visit

## 2023-11-20 DIAGNOSIS — Z719 Counseling, unspecified: Secondary | ICD-10-CM

## 2023-11-20 DIAGNOSIS — Z23 Encounter for immunization: Secondary | ICD-10-CM

## 2023-11-20 NOTE — Progress Notes (Signed)
 Pt in nurse clinic requesting MMR vaccine recommended by her  PCP (Dr. Alayne Allis). 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 Pt states she can't find her vaccine records anywhere. Eligible for MMR vaccine, discussed VIS and given to pt. Administered vaccine, tolerated well. Given [facility] copy, informed of next vaccines due, pt verbalized understanding. M.Aloys Hupfer, LPN.

## 2023-12-06 ENCOUNTER — Ambulatory Visit: Payer: Self-pay | Admitting: Family Medicine

## 2023-12-10 ENCOUNTER — Ambulatory Visit: Admitting: Family Medicine

## 2023-12-10 VITALS — BP 118/78 | HR 59 | Ht 67.0 in | Wt 179.5 lb

## 2023-12-10 DIAGNOSIS — I10 Essential (primary) hypertension: Secondary | ICD-10-CM | POA: Diagnosis not present

## 2023-12-10 DIAGNOSIS — H6501 Acute serous otitis media, right ear: Secondary | ICD-10-CM | POA: Diagnosis not present

## 2023-12-10 DIAGNOSIS — H6991 Unspecified Eustachian tube disorder, right ear: Secondary | ICD-10-CM | POA: Diagnosis not present

## 2023-12-10 DIAGNOSIS — H9311 Tinnitus, right ear: Secondary | ICD-10-CM | POA: Diagnosis not present

## 2023-12-10 MED ORDER — AMLODIPINE BESYLATE 2.5 MG PO TABS: 2.5000 mg | ORAL_TABLET | Freq: Every day | ORAL | 1 refills | Status: AC

## 2023-12-10 MED ORDER — FLUTICASONE PROPIONATE 50 MCG/ACT NA SUSP: 2.0000 | Freq: Every day | NASAL | 6 refills | Status: AC

## 2023-12-10 MED ORDER — HYDROCHLOROTHIAZIDE 12.5 MG PO TABS: 12.5000 mg | ORAL_TABLET | Freq: Every day | ORAL | 1 refills | Status: DC

## 2023-12-10 NOTE — Progress Notes (Unsigned)
 Date:  12/10/2023   Name:  Veronica Berg   DOB:  12-18-1975   MRN:  [id number]   Chief Complaint: Hypertension  Hypertension This is a chronic problem. The current episode started more than 1 year ago. The problem has been gradually improving since onset. The problem is controlled. Pertinent negatives include no anxiety, blurred vision, chest pain, headaches, malaise/fatigue, neck pain, orthopnea, palpitations, peripheral edema, PND, shortness of breath or sweats. There are no associated agents to hypertension.    Lab Results  Component Value Date   NA 137 07/30/2023   K 3.9 07/30/2023   CO2 26 07/30/2023   GLUCOSE 84 07/30/2023   BUN 15 07/30/2023   CREATININE 0.62 07/30/2023   CALCIUM 9.2 07/30/2023   EGFR 110 07/30/2023   GFRNONAA 110 05/19/2019   Lab Results  Component Value Date   CHOL 171 09/14/2021   HDL 43 09/14/2021   LDLCALC 119 (H) 09/14/2021   TRIG 45 09/14/2021   Lab Results  Component Value Date   TSH 1.320 05/19/2019   Lab Results  Component Value Date   HGBA1C 5.4 09/14/2021   Lab Results  Component Value Date   WBC 7.3 09/14/2021   HGB 11.5 09/14/2021   HCT 35.0 09/14/2021   MCV 90 09/14/2021   PLT 276 09/14/2021   No results found for: "ALT", "AST", "GGT", "ALKPHOS", "BILITOT" No results found for: "25OHVITD2", "25OHVITD3", "VD25OH"   Review of Systems  Constitutional:  Negative for malaise/fatigue.  HENT:  Positive for hearing loss, rhinorrhea, sneezing and tinnitus. Negative for congestion, ear discharge, ear pain, facial swelling, mouth sores, nosebleeds, postnasal drip, sinus pressure, sinus pain, sore throat, trouble swallowing and voice change.   Eyes:  Negative for blurred vision.  Respiratory:  Negative for cough, choking, chest tightness, shortness of breath, wheezing and stridor.   Cardiovascular:  Negative for chest pain, palpitations, orthopnea, leg swelling and PND.  Musculoskeletal:  Negative for neck pain.  Neurological:   Negative for headaches.    There are no active problems to display for this patient.   Allergies  Allergen Reactions   Carvedilol      "Chest tightness"    Past Surgical History:  Procedure Laterality Date   NO PAST SURGERIES      Social History   Tobacco Use   Smoking status: Former   Smokeless tobacco: Never  Vaping Use   Vaping status: Never Used  Substance Use Topics   Alcohol use: Yes    Comment: occasionally   Drug use: No     Medication list has been reviewed and updated.  Current Meds  Medication Sig   amLODipine  (NORVASC ) 2.5 MG tablet Take 1 tablet (2.5 mg total) by mouth daily.   b complex vitamins tablet Take 1 tablet by mouth daily.   hydrochlorothiazide  (HYDRODIURIL ) 12.5 MG tablet Take 1 tablet (12.5 mg total) by mouth daily.       12/10/2023    3:35 PM 07/30/2023    3:58 PM 01/14/2023    4:17 PM 12/07/2022    3:48 PM  GAD 7 : Generalized Anxiety Score  Nervous, Anxious, on Edge 0 0 0 0  Control/stop worrying 0 0 0 0  Worry too much - different things 0 0 0 0  Trouble relaxing 0 0 0 0  Restless 0 0 0 0  Easily annoyed or irritable 0 0 0 0  Afraid - awful might happen 0 0 0 0  Total GAD 7 Score 0 0 0  0  Anxiety Difficulty  Not difficult at all Not difficult at all Not difficult at all       12/10/2023    3:35 PM 07/30/2023    3:58 PM 01/14/2023    4:17 PM  Depression screen PHQ 2/9  Decreased Interest 0 0 0  Down, Depressed, Hopeless 0 0 0  PHQ - 2 Score 0 0 0  Altered sleeping 0 0 0  Tired, decreased energy 0 0 0  Change in appetite 0 0 0  Feeling bad or failure about yourself  0 0 0  Trouble concentrating 0 0 0  Moving slowly or fidgety/restless 0 0 0  Suicidal thoughts 0 0 0  PHQ-9 Score 0 0 0  Difficult doing work/chores  Not difficult at all Not difficult at all    BP Readings from Last 3 Encounters:  12/10/23 118/78  11/12/23 116/76  07/30/23 110/68    Physical Exam Vitals and nursing note reviewed.  Constitutional:       General: She is not in acute distress.    Appearance: She is not diaphoretic.  HENT:     Head: Normocephalic and atraumatic.     Right Ear: External ear normal.     Left Ear: External ear normal.     Nose: Nose normal.  Eyes:     General:        Right eye: No discharge.        Left eye: No discharge.     Conjunctiva/sclera: Conjunctivae normal.     Pupils: Pupils are equal, round, and reactive to light.  Neck:     Thyroid: No thyromegaly.     Vascular: No JVD.  Cardiovascular:     Rate and Rhythm: Normal rate and regular rhythm.     Heart sounds: Normal heart sounds. No murmur heard.    No friction rub. No gallop.  Pulmonary:     Effort: Pulmonary effort is normal. No respiratory distress.     Breath sounds: Normal breath sounds. No wheezing or rales.  Chest:     Chest wall: No tenderness.  Abdominal:     General: Bowel sounds are normal.     Palpations: Abdomen is soft. There is no mass.     Tenderness: There is no abdominal tenderness. There is no guarding.  Musculoskeletal:        General: Normal range of motion.     Cervical back: Normal range of motion and neck supple.  Lymphadenopathy:     Cervical: No cervical adenopathy.     Right cervical: No superficial, deep or posterior cervical adenopathy.    Left cervical: No superficial, deep or posterior cervical adenopathy.  Skin:    General: Skin is warm and dry.  Neurological:     Mental Status: She is alert.     Deep Tendon Reflexes: Reflexes are normal and symmetric.     Wt Readings from Last 3 Encounters:  12/10/23 179 lb 8 oz (81.4 kg)  11/12/23 170 lb (77.1 kg)  07/30/23 183 lb (83 kg)    BP 118/78   Pulse (!) 59   Ht 5\' 7"  (1.702 m)   Wt 179 lb 8 oz (81.4 kg)   LMP 10/28/2023   SpO2 100%   BMI 28.11 kg/m   Assessment and Plan: 1. Tinnitus of right ear (Primary) New onset.  But persistent.  Waxes and wanes but is always constant in someway before.  There may be a pulsatile component to this.  I do  not think this is her pulses transmitted via bone but would like to refer to ear nose and throat for further checking and that she has had discomfort behind her right eye. - Ambulatory referral to ENT  2. Primary hypertension Chronic.  Controlled.  Stable.  Asymptomatic.  Blood pressure today is 118/78.  Continue amlodipine  2.5 mg once a day and hydrochlorothiazide  12.5 mg once a day.  Will recheck patient in 6 months.  Review of previous labs are acceptable at this time. - amLODipine  (NORVASC ) 2.5 MG tablet; Take 1 tablet (2.5 mg total) by mouth daily.  Dispense: 90 tablet; Refill: 1 - hydrochlorothiazide  (HYDRODIURIL ) 12.5 MG tablet; Take 1 tablet (12.5 mg total) by mouth daily.  Dispense: 90 tablet; Refill: 1  3. Non-recurrent acute serous otitis media of right ear Patient has a serous otitis media of the right ear suggesting there is eustachian tube dysfunction and we will treat with fluticasone .  4. Dysfunction of right eustachian tube Patient has discomfort as noted above in the right ear and observation notes a serous otitis media which may be secondary to eustachian tube dysfunction.  Patient is unable to clear her right ear.    Alayne Allis, MD

## 2023-12-11 DIAGNOSIS — E221 Hyperprolactinemia: Secondary | ICD-10-CM | POA: Diagnosis not present

## 2023-12-11 DIAGNOSIS — D497 Neoplasm of unspecified behavior of endocrine glands and other parts of nervous system: Secondary | ICD-10-CM | POA: Diagnosis not present

## 2023-12-11 NOTE — Progress Notes (Addendum)
 History of present illness Veronica Berg is seen today for follow-up pituitary tumor and hyperprolactinemia.  She is a 48 y.o. female who was found to have an elevated prolactin as part of a work-up for irregular menstrual cycles in 2017.  MRI was performed which showed a small pituitary tumor.  She saw an endocrinologist at [facility] once but she never followed up.  I last saw her in 1/23.  She no-showed in 1/24.   She has done well since that time.  She say she has been having headaches pressure behind the right eye for about 6 months.  She also says her right eyelid droops more.  She says she has had pulsating in her right ear during this time as well.  She has been working more on the computer which she wonders could be playing a role.   She overall feels well.  She does not have galactorrhea.  Her menstrual cycles have been slightly irregular.  She would prefer not to be on medication for her pituitary if she doesn't have to be.  She has no blind spots in her vision.  She has headaches as above.  She is concerned about her pituitary.   ROS:  No chest pain.  No shortness of breath.   Medical History: Past Medical History:  Diagnosis Date   Hyperprolactinemia (CMS/HHS-HCC)    Pituitary tumor    8x53mm in 11/17    Surgical History: No past surgical history on file.  Social History:  reports that she has quit smoking. She has never used smokeless tobacco. She reports current alcohol use. She reports that she does not use drugs.  She is single.  She works at the [employer] in [place]  Family History: family history includes Diabetes in her mother; Lung cancer in her father.  Medications: Current Outpatient Medications  Medication Sig Dispense Refill   amLODIPine  (NORVASC ) 2.5 MG tablet Take 2.5 mg by mouth once daily     hydroCHLOROthiazide  (HYDRODIURIL ) 12.5 MG tablet TAKE 1 TABLET(12.5 MG) BY MOUTH DAILY     No current facility-administered medications for this visit.    Allergies: No Known Allergies  Physical Exam: Vitals:   12/11/23 1033  BP: 122/80  Pulse: 75  SpO2: 99%  Weight: 81.6 kg (180 lb)  Height: 172.7 cm (5' 8)    Body mass index is 27.37 kg/m. GENERAL: Pleasant, well-appearing female in no distress.   Physical exam otherwise deferred due to coronavirus precautions.   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 08/13/2016: Cortisol (12:19 PM) = 6.7, prolactin = 50, IGF-I = 83 (49-240), TSH = 1.59.  Free T4 = 0.81 05/19/2019: Prolactin = 50.3.  K/Cr/Ca = 4.2/0.64/9.0.  TSH = 1.32 07/21/2019: Pituitary MRI showed a right inferior pituitary tumor measuring 5 x 3 mm.  06/03/2020:  Prolactin=33.4 (4.8-23.3).  07/31/2021: Prolactin = 35.9 (4.8-23.3).Veronica Berg  Assessment/Plan: 1.  Pituitary tumor.  She had a pituitary tumor found in 11/17 and work-up of hyperprolactinemia.  Hormonal evaluation was otherwise negative.  She had an MRI in 1/21 that showed no significant change from 11/17.  I have therefore been following her without therapy.  I ordered a repeat MRI.   2.  Hyperprolactinemia.  She does not have any symptoms and does not really want therapy if not necessary.  I think this is reasonable since her tumor has not enlarged from 2017-2021 as above.  Consider cabergoline in  the future if her tumor ever enlarges.  For now, I will continue to monitor her since she does not have galactorrhea or irregular cycles.  Her prolactin was slightly above nl in 1/23.  I will recheck her prolactin level today and only initiate tx if it has gone up significantly.  Would also consider tx if tumor has enlarged.  3.  Headaches/Ear discomfort.  She says she has been having headaches with pressure pain behind right eye and pulsating in her right ear.  I told her that is unlikely due to her pituitary but if her tumor has enlarged significantly, it might be  playing a role.  I ordered an MRI as above.  She was referred to ENT by her PCP.  I recommended she follow up with them as well.   3.  She will return to clinic in 12 months.  Addendum:  12/11/2023 :  K/Cr/Ca= 4.4/0.6/9.5.  Prolactin=26.8.  Results sent via MyChart 12/12/23:  Called and did peer to peer to get authorization for MRI.  12/23/2023: Pituitary MRI with small 5 x 3 mm posterior right pituitary lesion unchanged from 07/21/2019.   Results sent via MyChart   This note is partially prepared by [facility], Scribe, in the presence of and acting as the scribe of Dr. Debby Breaker , MD.     [facility], MD

## 2023-12-12 ENCOUNTER — Other Ambulatory Visit: Payer: Self-pay | Admitting: "Endocrinology

## 2023-12-12 DIAGNOSIS — E221 Hyperprolactinemia: Secondary | ICD-10-CM

## 2023-12-12 DIAGNOSIS — D497 Neoplasm of unspecified behavior of endocrine glands and other parts of nervous system: Secondary | ICD-10-CM

## 2023-12-17 DIAGNOSIS — G909 Disorder of the autonomic nervous system, unspecified: Secondary | ICD-10-CM | POA: Diagnosis not present

## 2023-12-17 DIAGNOSIS — R490 Dysphonia: Secondary | ICD-10-CM | POA: Diagnosis not present

## 2023-12-17 DIAGNOSIS — H9311 Tinnitus, right ear: Secondary | ICD-10-CM | POA: Diagnosis not present

## 2023-12-17 DIAGNOSIS — H6983 Other specified disorders of Eustachian tube, bilateral: Secondary | ICD-10-CM | POA: Diagnosis not present

## 2023-12-17 DIAGNOSIS — D352 Benign neoplasm of pituitary gland: Secondary | ICD-10-CM | POA: Diagnosis not present

## 2023-12-20 ENCOUNTER — Ambulatory Visit

## 2023-12-20 DIAGNOSIS — Z719 Counseling, unspecified: Secondary | ICD-10-CM

## 2023-12-20 DIAGNOSIS — Z23 Encounter for immunization: Secondary | ICD-10-CM | POA: Diagnosis not present

## 2023-12-20 NOTE — Progress Notes (Signed)
 In nurse clinic for MMR (dose #2). 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   MMR given and tolerated well. Updated [facility]IR copy given and reviewed. Kourtney Terriquez, RN

## 2023-12-23 ENCOUNTER — Ambulatory Visit
Admission: RE | Admit: 2023-12-23 | Discharge: 2023-12-23 | Disposition: A | Discharge status: Home / Self Care | Source: Ambulatory Visit | Attending: "Endocrinology | Admitting: "Endocrinology

## 2023-12-23 DIAGNOSIS — D352 Benign neoplasm of pituitary gland: Secondary | ICD-10-CM | POA: Diagnosis not present

## 2023-12-23 DIAGNOSIS — E221 Hyperprolactinemia: Secondary | ICD-10-CM | POA: Diagnosis not present

## 2023-12-23 DIAGNOSIS — D497 Neoplasm of unspecified behavior of endocrine glands and other parts of nervous system: Secondary | ICD-10-CM | POA: Diagnosis not present

## 2023-12-23 DIAGNOSIS — R519 Headache, unspecified: Secondary | ICD-10-CM | POA: Diagnosis not present

## 2023-12-23 MED ORDER — GADOBUTROL 1 MMOL/ML IV SOLN
8.0000 mL | Freq: Once | INTRAVENOUS | Status: AC | PRN
  Administered 2023-12-23: 8 mL via INTRAVENOUS

## 2024-03-04 NOTE — Progress Notes (Signed)
 Preoperative Diagnosis:  Visually significant blepharoptosis Right Upper Eyelid(s)  Postoperative Diagnosis:  Same.  Procedure(s) Performed:   Blepharoptosis repair with tarsoconjunctival mullerectomy Right Upper Eyelid(s)  Teaching Surgeon: Lonni DOROTHA Maillard, MD  Assistants: Leta Fairly, MD  Anesthesia:  Local  Specimens: None.  Estimated Blood Loss: Less than 10mL.  Complications: None.  Implants: None  Operative Findings: None   Indications for Procedure:  The patient was previously seen in the office and evaluated prior to surgery and found to have visually significant ptosis as demonstrated by pictures and visual fields. The risks, benefits, and alternatives of the procedure were discussed in detail and informed consent was obtained.  Description of procedure:  Allergies were reviewed and the patient has no known allergies..   On the day of surgery, the patient was greeted in the pre-operative area and the site was marked. All questions were answered at that time. The patient was then brought to the operating suite and reclined supine. A timeout was perfomed and surgical sites were marked. Local anesthetic consisting of 2% lidocaine with 1:100000 epinephrine  was injected subcutaneously to the operative sites. The patient was then prepped and draped in the usual sterile fashion for oculofacial surgery.   Attention was turned to the ptosis surgery.  A 4-0 silk traction suture was placed through the grey line.  This suture would be removed at the end of the procedure.  The eyelid was everted over a Desmarres retraction.  The central, medial 1/3, and lateral 1/3 aspects of the tarsus were marked with marking pen.  Local anesthetic of 2% lidocaine with epinephrine 1:100,000 was injected subconjunctivally.  A 15 blade was used to incise the conjunctiva along the inferior border of tarsus.  The conjunctiva was then dissected from the underlying Muller's muscle using Westcott  scissors and cotton-tipped applicators.  Vertical relaxing incisions in the conjunctival flap were created with Westcott scissors.  A caliper and marking pen were then used to mark 4.6mm from the tarsal border for a  9mm Muller's muscle plication.  The Muller's muscle was grasped with forceps at the marking, the Desmarres retractor was removed, and the Muller's muscle was tented up.  A bipolar was then used to grasp the tented Muller's muscle horizontally along the tarsal border and energy was applied to the muscle, plicating the Muller's muscle.  The conjunctival flap was then repositioned and the eyelid returned to it's normal position.  This completed the ptosis surgery.   The drapes were removed and the patients face was cleaned. Erythromycin ointment was then applied to the the eye and the eyelid followed by patch.  Teaching Surgeon Attestation: I performed the entire procedure.   Lonni DOROTHA Maillard, MD/[facility] Oculofacial Plastic & Reconstructive Surgery

## 2024-07-29 ENCOUNTER — Ambulatory Visit: Admitting: Family Medicine

## 2024-07-29 ENCOUNTER — Encounter: Payer: Self-pay | Admitting: Family Medicine

## 2024-07-29 VITALS — BP 118/76 | HR 92 | Ht 67.0 in | Wt 186.5 lb

## 2024-07-29 DIAGNOSIS — E785 Hyperlipidemia, unspecified: Secondary | ICD-10-CM | POA: Diagnosis not present

## 2024-07-29 DIAGNOSIS — Z130 Encounter for screening for diseases of the blood and blood-forming organs and certain disorders involving the immune mechanism: Secondary | ICD-10-CM

## 2024-07-29 DIAGNOSIS — Z1159 Encounter for screening for other viral diseases: Secondary | ICD-10-CM

## 2024-07-29 DIAGNOSIS — Z1211 Encounter for screening for malignant neoplasm of colon: Secondary | ICD-10-CM

## 2024-07-29 DIAGNOSIS — Z6829 Body mass index (BMI) 29.0-29.9, adult: Secondary | ICD-10-CM

## 2024-07-29 DIAGNOSIS — Z1231 Encounter for screening mammogram for malignant neoplasm of breast: Secondary | ICD-10-CM

## 2024-07-29 DIAGNOSIS — I10 Essential (primary) hypertension: Secondary | ICD-10-CM

## 2024-07-29 DIAGNOSIS — Z136 Encounter for screening for cardiovascular disorders: Secondary | ICD-10-CM | POA: Diagnosis not present

## 2024-07-29 DIAGNOSIS — N959 Unspecified menopausal and perimenopausal disorder: Secondary | ICD-10-CM

## 2024-07-29 DIAGNOSIS — Z13220 Encounter for screening for lipoid disorders: Secondary | ICD-10-CM

## 2024-07-29 DIAGNOSIS — Z131 Encounter for screening for diabetes mellitus: Secondary | ICD-10-CM | POA: Diagnosis not present

## 2024-07-29 MED ORDER — SERTRALINE HCL 25 MG PO TABS: 25.0000 mg | ORAL_TABLET | Freq: Every day | ORAL | 3 refills | Status: AC

## 2024-07-29 NOTE — Patient Instructions (Signed)
 Call [facility] to schedule your mammogram at [phone].

## 2024-07-29 NOTE — Progress Notes (Signed)
 "  Established Patient Office Visit  Patient ID: Veronica Berg, female    DOB: 10-31-1975  Age: 49 y.o. MRN: [id number] PCP: Kathryn Cosby K, MD  Chief Complaint  Patient presents with   Establish Care    Subjective:     HPI  Discussed the use of AI scribe software for clinical note transcription with the patient, who gave verbal consent to proceed.  History of Present Illness The patient presents for establishing care and completing a health eligibility form for employment.  She is currently on amlodipine  2.5 mg and hydrochlorothiazide  for hypertension. Hypertension is prevalent in her family.  She has a history of a small pituitary tumor, specifically a 5 by 3 mm hypo-enhanced lesion in the posterior right pituitary gland, which is monitored every two years by an endocrinologist. The tumor has remained unchanged.  She reports mild anemia, which is being monitored, and her kidney function has been normal as of last year.  She is overdue for a mammogram, with the last one being two years ago, and she had a Pap smear in February 2023, which was HPV negative. She believes she is in premenopause, experiencing irregular menstrual cycles and symptoms such as mood swings and crying spells.  She has a family history of lung cancer, as her father, a smoker and surveyor, minerals, had lung cancer.  She is single, lives with her 80 year old son, and works in research scientist (medical) at [employer]. She is seeking a second job with the [employer].      Review of Systems  All other systems reviewed and are negative.     Objective:     BP 118/76   Pulse 92   Ht 5' 7 (1.702 m)   Wt 186 lb 8 oz (84.6 kg)   LMP 07/26/2024 (Approximate)   SpO2 98%   BMI 29.21 kg/m     Physical Exam Vitals and nursing note reviewed.  Constitutional:      Appearance: Normal appearance.  HENT:     Head: Normocephalic.     Right Ear: External ear normal.     Left Ear: External ear normal.   Eyes:     Conjunctiva/sclera: Conjunctivae normal.  Cardiovascular:     Rate and Rhythm: Normal rate.  Pulmonary:     Effort: Pulmonary effort is normal. No respiratory distress.  Abdominal:     Palpations: Abdomen is soft.  Musculoskeletal:        General: Normal range of motion.  Skin:    General: Skin is warm.  Neurological:     Mental Status: She is alert and oriented to person, place, and time.  Psychiatric:        Mood and Affect: Mood normal.     Physical Exam    No results found for any visits on 07/29/24.     The 10-year ASCVD risk score (Arnett DK, et al., 2019) is: 2.8%    Assessment & Plan:   Problem List Items Addressed This Visit       Cardiovascular and Mediastinum   Primary hypertension - Primary   Relevant Orders   Comprehensive metabolic panel with GFR     Other   Dyslipidemia   Relevant Orders   Lipid panel   Other Visit Diagnoses       Breast cancer screening by mammogram       Relevant Orders   MM 3D SCREENING MAMMOGRAM BILATERAL BREAST     Screening for colon cancer  Relevant Orders   Ambulatory referral to Gastroenterology     Encounter for lipid screening for cardiovascular disease         Diabetes mellitus screening       Relevant Orders   Hemoglobin A1c     Encounter for hepatitis C screening test for low risk patient       Relevant Orders   HCV Ab w Reflex to Quant PCR     Screening for iron deficiency anemia       Relevant Orders   CBC with Differential/Platelet     BMI 29.0-29.9,adult       Relevant Orders   TSH     Perimenopausal disorder       Relevant Medications   sertraline  (ZOLOFT ) 25 MG tablet       Assessment and Plan Assessment & Plan Primary hypertension Hypertension well-controlled with current medication regimen. - Continue amlodipine  2.5 mg daily. - Continue hydrochlorothiazide  as prescribed.  Perimenopausal disorder with mood symptoms Irregular cycles and mood swings consistent with  perimenopausal disorder. Discussed Zoloft  for mood symptoms, informed about side effects, and agreed to consider low dose. - Consider starting Zoloft  at low dose for mood symptoms.  General Health Maintenance Overdue for mammogram and colonoscopy. Discussed colonoscopy due to family history and increased risk. Agreed to proceed despite anesthesia concerns. - Ordered mammogram. - Ordered colonoscopy. - Ordered blood work for anemia and kidney function. - Scheduled physical exam in April.    No follow-ups on file.    Luccia Reinheimer K Takoda Janowiak, MD [facility] at [facility]   "

## 2024-07-30 ENCOUNTER — Ambulatory Visit: Payer: Self-pay | Admitting: Family Medicine

## 2024-07-30 LAB — COMPREHENSIVE METABOLIC PANEL WITH GFR
ALT: 16 IU/L (ref 0–32)
AST: 17 IU/L (ref 0–40)
Albumin: 4.5 g/dL (ref 3.9–4.9)
Alkaline Phosphatase: 52 IU/L (ref 41–116)
BUN/Creatinine Ratio: 22 (ref 9–23)
BUN: 12 mg/dL (ref 6–24)
Bilirubin Total: 0.5 mg/dL (ref 0.0–1.2)
CO2: 24 mmol/L (ref 20–29)
Calcium: 9 mg/dL (ref 8.7–10.2)
Chloride: 98 mmol/L (ref 96–106)
Creatinine, Ser: 0.55 mg/dL — ABNORMAL LOW (ref 0.57–1.00)
Globulin, Total: 2.8 g/dL (ref 1.5–4.5)
Glucose: 107 mg/dL — ABNORMAL HIGH (ref 70–99)
Potassium: 3.6 mmol/L (ref 3.5–5.2)
Sodium: 136 mmol/L (ref 134–144)
Total Protein: 7.3 g/dL (ref 6.0–8.5)
eGFR: 112 mL/min/1.73

## 2024-07-30 LAB — CBC WITH DIFFERENTIAL/PLATELET
Basophils Absolute: 0 x10E3/uL (ref 0.0–0.2)
Basos: 1 %
EOS (ABSOLUTE): 0.2 x10E3/uL (ref 0.0–0.4)
Eos: 3 %
Hematocrit: 41 % (ref 34.0–46.6)
Hemoglobin: 12.8 g/dL (ref 11.1–15.9)
Immature Grans (Abs): 0 x10E3/uL (ref 0.0–0.1)
Immature Granulocytes: 0 %
Lymphocytes Absolute: 1 x10E3/uL (ref 0.7–3.1)
Lymphs: 16 %
MCH: 29.4 pg (ref 26.6–33.0)
MCHC: 31.2 g/dL — ABNORMAL LOW (ref 31.5–35.7)
MCV: 94 fL (ref 79–97)
Monocytes Absolute: 0.4 x10E3/uL (ref 0.1–0.9)
Monocytes: 7 %
Neutrophils Absolute: 4.4 x10E3/uL (ref 1.4–7.0)
Neutrophils: 72 %
Platelets: 289 x10E3/uL (ref 150–450)
RBC: 4.35 x10E6/uL (ref 3.77–5.28)
RDW: 13 % (ref 11.7–15.4)
WBC: 6 x10E3/uL (ref 3.4–10.8)

## 2024-07-30 LAB — HCV INTERPRETATION

## 2024-07-30 LAB — LIPID PANEL
Chol/HDL Ratio: 3.4 ratio (ref 0.0–4.4)
Cholesterol, Total: 170 mg/dL (ref 100–199)
HDL: 50 mg/dL
LDL Chol Calc (NIH): 106 mg/dL — ABNORMAL HIGH (ref 0–99)
Triglycerides: 72 mg/dL (ref 0–149)
VLDL Cholesterol Cal: 14 mg/dL (ref 5–40)

## 2024-07-30 LAB — HEMOGLOBIN A1C
Est. average glucose Bld gHb Est-mCnc: 105 mg/dL
Hgb A1c MFr Bld: 5.3 % (ref 4.8–5.6)

## 2024-07-30 LAB — HCV AB W REFLEX TO QUANT PCR: HCV Ab: NONREACTIVE

## 2024-07-30 LAB — TSH: TSH: 1.91 u[IU]/mL (ref 0.450–4.500)

## 2024-08-12 ENCOUNTER — Other Ambulatory Visit: Payer: Self-pay

## 2024-08-12 DIAGNOSIS — I10 Essential (primary) hypertension: Secondary | ICD-10-CM

## 2024-08-12 MED ORDER — HYDROCHLOROTHIAZIDE 12.5 MG PO TABS: 12.5000 mg | ORAL_TABLET | Freq: Every day | ORAL | 1 refills | Status: AC

## 2024-08-31 ENCOUNTER — Ambulatory Visit

## 2024-10-28 ENCOUNTER — Encounter: Admitting: Family Medicine
